# Patient Record
Sex: Female | Born: 1955 | Race: White | Hispanic: No | Marital: Single | Smoking: Never smoker
Health system: Southern US, Community
[De-identification: ages and names within clinical notes are randomized; demographics above are authoritative.]

## PROBLEM LIST (undated history)

## (undated) DIAGNOSIS — F419 Anxiety disorder, unspecified: Secondary | ICD-10-CM

## (undated) HISTORY — PX: SPINE SURGERY: SHX786

## (undated) HISTORY — DX: Anxiety disorder, unspecified: F41.9

## (undated) HISTORY — PX: BREAST SURGERY: SHX581

---

## 1998-05-31 ENCOUNTER — Other Ambulatory Visit: Admission: RE | Admit: 1998-05-31 | Discharge: 1998-05-31 | Payer: Self-pay | Admitting: *Deleted

## 1999-10-22 ENCOUNTER — Other Ambulatory Visit: Admission: RE | Admit: 1999-10-22 | Discharge: 1999-10-22 | Payer: Self-pay | Admitting: *Deleted

## 2000-11-10 ENCOUNTER — Other Ambulatory Visit: Admission: RE | Admit: 2000-11-10 | Discharge: 2000-11-10 | Payer: Self-pay | Admitting: *Deleted

## 2002-06-05 ENCOUNTER — Inpatient Hospital Stay (HOSPITAL_COMMUNITY): Admission: EM | Admit: 2002-06-05 | Discharge: 2002-06-07 | Payer: Self-pay | Admitting: Psychiatry

## 2002-06-20 ENCOUNTER — Other Ambulatory Visit: Admission: RE | Admit: 2002-06-20 | Discharge: 2002-06-20 | Payer: Self-pay | Admitting: *Deleted

## 2003-10-29 ENCOUNTER — Other Ambulatory Visit: Admission: RE | Admit: 2003-10-29 | Discharge: 2003-10-29 | Payer: Self-pay | Admitting: *Deleted

## 2004-12-11 ENCOUNTER — Other Ambulatory Visit: Admission: RE | Admit: 2004-12-11 | Discharge: 2004-12-11 | Payer: Self-pay | Admitting: *Deleted

## 2005-02-10 ENCOUNTER — Other Ambulatory Visit: Admission: RE | Admit: 2005-02-10 | Discharge: 2005-02-10 | Payer: Self-pay | Admitting: *Deleted

## 2005-07-27 ENCOUNTER — Other Ambulatory Visit: Admission: RE | Admit: 2005-07-27 | Discharge: 2005-07-27 | Payer: Self-pay | Admitting: *Deleted

## 2005-11-18 ENCOUNTER — Encounter: Admission: RE | Admit: 2005-11-18 | Discharge: 2005-11-18 | Payer: Self-pay | Admitting: Sports Medicine

## 2005-11-20 ENCOUNTER — Inpatient Hospital Stay (HOSPITAL_COMMUNITY): Admission: AD | Admit: 2005-11-20 | Discharge: 2005-11-21 | Payer: Self-pay | Admitting: Neurosurgery

## 2006-02-25 ENCOUNTER — Other Ambulatory Visit: Admission: RE | Admit: 2006-02-25 | Discharge: 2006-02-25 | Payer: Self-pay | Admitting: *Deleted

## 2006-07-05 ENCOUNTER — Inpatient Hospital Stay: Payer: Self-pay | Admitting: Psychiatry

## 2006-10-28 ENCOUNTER — Ambulatory Visit: Payer: Self-pay | Admitting: Family Medicine

## 2006-10-28 DIAGNOSIS — F5000 Anorexia nervosa, unspecified: Secondary | ICD-10-CM | POA: Insufficient documentation

## 2006-11-03 ENCOUNTER — Ambulatory Visit: Payer: Self-pay | Admitting: Sports Medicine

## 2006-11-03 DIAGNOSIS — E559 Vitamin D deficiency, unspecified: Secondary | ICD-10-CM | POA: Insufficient documentation

## 2006-11-03 DIAGNOSIS — IMO0002 Reserved for concepts with insufficient information to code with codable children: Secondary | ICD-10-CM | POA: Insufficient documentation

## 2006-11-04 ENCOUNTER — Ambulatory Visit: Payer: Self-pay | Admitting: Family Medicine

## 2006-12-28 ENCOUNTER — Ambulatory Visit: Payer: Self-pay | Admitting: Sports Medicine

## 2006-12-28 DIAGNOSIS — M461 Sacroiliitis, not elsewhere classified: Secondary | ICD-10-CM | POA: Insufficient documentation

## 2006-12-28 DIAGNOSIS — M25559 Pain in unspecified hip: Secondary | ICD-10-CM | POA: Insufficient documentation

## 2006-12-28 DIAGNOSIS — F329 Major depressive disorder, single episode, unspecified: Secondary | ICD-10-CM | POA: Insufficient documentation

## 2007-01-25 ENCOUNTER — Ambulatory Visit: Payer: Self-pay | Admitting: Sports Medicine

## 2007-04-26 ENCOUNTER — Other Ambulatory Visit: Admission: RE | Admit: 2007-04-26 | Discharge: 2007-04-26 | Payer: Self-pay | Admitting: *Deleted

## 2008-02-09 ENCOUNTER — Encounter (INDEPENDENT_AMBULATORY_CARE_PROVIDER_SITE_OTHER): Payer: Self-pay | Admitting: *Deleted

## 2008-04-16 ENCOUNTER — Ambulatory Visit: Payer: Self-pay | Admitting: Sports Medicine

## 2008-04-16 DIAGNOSIS — M533 Sacrococcygeal disorders, not elsewhere classified: Secondary | ICD-10-CM | POA: Insufficient documentation

## 2008-04-26 ENCOUNTER — Encounter: Payer: Self-pay | Admitting: Sports Medicine

## 2008-05-14 ENCOUNTER — Ambulatory Visit: Payer: Self-pay | Admitting: Sports Medicine

## 2008-05-14 DIAGNOSIS — M7750 Other enthesopathy of unspecified foot: Secondary | ICD-10-CM | POA: Insufficient documentation

## 2008-05-30 ENCOUNTER — Ambulatory Visit: Payer: Self-pay | Admitting: Sports Medicine

## 2008-05-30 DIAGNOSIS — M4120 Other idiopathic scoliosis, site unspecified: Secondary | ICD-10-CM | POA: Insufficient documentation

## 2008-08-15 ENCOUNTER — Encounter: Admission: RE | Admit: 2008-08-15 | Discharge: 2008-08-15 | Payer: Self-pay | Admitting: Gynecology

## 2009-07-16 ENCOUNTER — Ambulatory Visit: Payer: Self-pay | Admitting: Sports Medicine

## 2009-07-16 DIAGNOSIS — M2390 Unspecified internal derangement of unspecified knee: Secondary | ICD-10-CM | POA: Insufficient documentation

## 2009-07-16 DIAGNOSIS — M25469 Effusion, unspecified knee: Secondary | ICD-10-CM | POA: Insufficient documentation

## 2009-07-16 DIAGNOSIS — S8000XA Contusion of unspecified knee, initial encounter: Secondary | ICD-10-CM | POA: Insufficient documentation

## 2009-07-16 DIAGNOSIS — M7710 Lateral epicondylitis, unspecified elbow: Secondary | ICD-10-CM | POA: Insufficient documentation

## 2009-07-18 ENCOUNTER — Encounter: Payer: Self-pay | Admitting: Sports Medicine

## 2009-07-23 ENCOUNTER — Encounter: Payer: Self-pay | Admitting: Sports Medicine

## 2009-09-23 ENCOUNTER — Ambulatory Visit: Payer: Self-pay | Admitting: Family Medicine

## 2009-10-11 ENCOUNTER — Ambulatory Visit: Payer: Self-pay | Admitting: Family Medicine

## 2009-10-28 ENCOUNTER — Encounter: Payer: Self-pay | Admitting: Family Medicine

## 2010-01-13 ENCOUNTER — Ambulatory Visit: Payer: Self-pay | Admitting: Family Medicine

## 2010-01-13 DIAGNOSIS — F438 Other reactions to severe stress: Secondary | ICD-10-CM | POA: Insufficient documentation

## 2010-01-14 ENCOUNTER — Encounter: Payer: Self-pay | Admitting: Family Medicine

## 2010-02-07 ENCOUNTER — Ambulatory Visit: Payer: Self-pay | Admitting: Family Medicine

## 2010-02-28 ENCOUNTER — Emergency Department (HOSPITAL_COMMUNITY)
Admission: EM | Admit: 2010-02-28 | Discharge: 2010-02-28 | Payer: Self-pay | Source: Home / Self Care | Admitting: Emergency Medicine

## 2010-04-21 ENCOUNTER — Encounter: Payer: Self-pay | Admitting: Gynecology

## 2010-04-29 NOTE — Assessment & Plan Note (Signed)
Summary: ORTHOTICS   Vital Signs:  Patient Profile:   55 Years Old Female Height:     61 inches Pulse rate:   68 / minute BP sitting:   106 / 73  Vitals Entered By: Lillia Pauls CMA (May 30, 2008 8:57 AM)                 Chief Complaint:  ORTHOTICS.  History of Present Illness:  returns for orthotics Temp have helped lessen foot pain and low back and hip pain wants to run a marathon now training mostly on weekends and has done a 12 mile run this past week with some back pain  hip and SIJ doing much better        Physical Exam  General:     Well-developed,well-nourished,in no acute distress; alert,appropriate and cooperative throughout examination Msk:     marked transverse arch breakdown forefoot  2x width of heel MT pain is decreased heel pain is absent now  long arch is moderately collapsed    Impression & Recommendations:  Problem # 1:  METATARSALGIA (ICD-726.70) Temp inserts have helped but foot pain is still significant with a lot of arch collapse transverse and mod long  Patient was fitted for a standard, cushioned, semi-rigid orthotic.  The orthotic was heated and the patient stood on the orthotic blank positioned on the orthotic stand. The patient was positioned in subtalar neutral position and 10 degrees of ankle dorsiflexion in a weight bearing stance. After completion of molding a stable based was applied to the orthotic blank.   The blank was ground to a stable position for weight bearing. 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 One by mouth every 4-6 as needed for pain

## 2010-04-29 NOTE — Assessment & Plan Note (Signed)
Summary: KH   Vital Signs:  Patient Profile:   55 Years Old Female Height:     61 inches Weight:      105.8 pounds BMI:     20.06  Vitals Entered By: Wyona Almas PHD (November 04, 2006 2:42 PM)               History of Present Illness: Assessment:  Alyssa Alvarez has made a strong effort to eat 3 X day in the past week, successful on most days.  Although she did not journal, she did give much more thought to her food decisions and to the negative thoughts regarding food and weight.  She has determined that not eating is a slow form of suicide.  She also recognizes that she does want to meet the challenge of this depression and to begin feeling better, so she made a decision to try to nourish herself, aware now that depression is actually a sx of starvation, and medications won't be as effective at a severely low weight.  She has been eating a spinach salad for dinner most days, and yogurt for bkfst.  She went without the Red Bull on a couple days, w/ no bad effects.  Alyssa Alvarez is exploring the possibilty of in-pt tx for depression with her therapist Dayton Scrape, although her 3 experiences with such have been awful.  I suggested tx for her eating D/O (which would address psych co-morbidities as well) rather than for just depression, and I suggested Lu Duffel call me for suggested programs, if interested.  Nutrition Diagnosis:  Inadequate energy intake (NI-1.4) related to food restriction as evidenced by eating pattern of 2 meals/day and 24-hr recall of 1080 kcal.   Disordered eating pattern (NB-1.5) related to anorexia nervosa as evidenced by severe food and weight anxiety and food restrxn.    Intervention:   1. Journal negative thoughts re. food choices, and counter-journal.   2. Continue bkfst of 6 oz yogurt and dinner of spinach salad; 3 meals/day.  3. Write about:  What purpose does your eating D/O serve? 4. While away on business this wk:  email me no later than Wed re. journaling.   5. Stay connected  w/ friends vs. social w/drawal.    Monitoring/Eval:  Dietary intake, weight, and journal at F/U, 2 week.  [Wt range:  100-105.]

## 2010-04-29 NOTE — Letter (Signed)
Summary: [facility]/Prior Authorization request  Psychologist, educational Authorization request   Imported By: Marily Memos 01/14/2010 15:54:13  _____________________________________________________________________  External Attachment:    Type:   Image     Comment:   External Document
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
## 2010-04-29 NOTE — Assessment & Plan Note (Signed)
Summary: FU BACK/LEG PAIN/MJD   Vital Signs:  Patient Profile:   55 Years Old Female Height:     61 inches Pulse rate:   78 / minute BP sitting:   110 / 68  Vitals Entered By: Enid Baas MD (May 14, 2008 11:32 AM)                 Chief Complaint:  FU BACK AND LEG PAIN.  History of Present Illness: Alyssa Alvarez was much improved with medication first - down from a 10 to a 7 Went to PT with [name] - pain dropped to 2 of 10 after manipulation and therapy she has done her exercises for SI problems these help  she continues running has been able to increase mileage  ran on uneven surface of [place] and pain returned also having to get all her running into [employer] as working long hours  major financial problems can't afford PT as not in network        Physical Exam  General:     Well-developed,well-nourished,in no acute distress; alert,appropriate and cooperative throughout examination does not look in pain today  Msk:     neg SLR neg screen for disk si joint much more even and both mobile today lying cross over stretch is OK pretzel stretch brings out pain on left hip abduction and rotation strength is good Extremities:     widening of forefoot with collapse of transverse arch morton's calluses bunionettes  gait is loud with slight turnout of left foot/ forefoot slap  placed in temp orthotics and gait is definitely improved aslso less slap and not as loud    Impression & Recommendations:  Problem # 1:  SACROILIAC JOINT DYSFUNCTION (ICD-724.6) cont exrcises and add pretzel stretch  needs to get home program from PT as cannot afford RX at this point  I will reck in 2 wks  Problem # 2:  METATARSALGIA (ICD-726.70) given temp orthotics with MT pads also needs new shoes - badly worn small bunionettes as well  I would like to make custom orthotics but need to see if she can get some coverage Orders: Sports Insoles (L3510) Metatarsal  Pads (Z6109)   Complete Medication List: 1)  Tramadol Hcl 50 Mg Tabs (Tramadol hcl) .... One by mouth every 4-6 as needed for pain
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
## 2010-04-29 NOTE — Assessment & Plan Note (Signed)
Summary: eval fatigue / JCS   Vital Signs:  Patient Profile:   55 Years Old Female Height:     61 inches Weight:      102.9 pounds BMI:     19.51  Vitals Entered By: Wyona Almas PHD (October 28, 2006 1:01 PM)               History of Present Illness: Assessment:  Alyssa Alvarez was referred by Dr. Merri Brunette for eval of fatigue and possible eating D/O.  Alyssa Alvarez "quit eating" in 2002 when going thru a divorce, but had regained to  ~115 lb by Mar 2007, when she qualified for the [employer].  In Aug '07, she had back surg for 2 herniated disks & nerve entrapment, and in Nov '07, her boyfriend broke up w/ her.  Loss of her running as w as her boyfriend again triggered severe restrxn.  July 9 08 lab's indicate low potassium and hgb.  Usual eating pattern:  B- 1 Red Bull; L- out to eat w/ coworkers; D- 1 martini at home (3 shots total).  24-hr recall suggests kcal intake of 1080, although yesterday was a high-intake day.  Ex is erratic; averages 1 60-min walk-run per week.    Nutrition Diagnosis:  Inadequate energy intake (NI-1.4) related to food restriction as evidenced by eating pattern of 2 meals/day and 24-hr recall of 1080 kcal.   Disordered eating pattern (NB-1.5) related to anorexia nervosa as evidenced by severe food and weight anxiety and food restrxn.    Intervention:   1. Journal negative thoughts re. food choices and counter-journal, as explained today.   2. Breakfast:  Include at least 4 oz yogurt, and continue caffeine source, but reduced.   3. Dinner:  Pt is willing to try a spinach salad.   4. Did not address alcohol intake at this time.   5. Pt agreed to wt range of 100-105, blind wts at appts, and trusting me to make dietary adjustmts as needed to maintain that range.  (Plan is to increase kcal over time.)  Monitoring/Eval:  Dietary intake, weight, and journal at F/U, 1 week.  See Dr. Darrick Penna for injury eval next week, and obtain recommendations for ex.                       Appended Document: eval fatigue / JCS I reviewed and agree with treatment plan and approach.
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
## 2010-04-29 NOTE — Assessment & Plan Note (Signed)
Summary: F/U,[facility]   Vital Signs:  Patient profile:   55 year old female BP sitting:   102 / 69  Vitals Entered By: Lillia Pauls CMA (October 11, 2009 10:51 AM)  History of Present Illness: f/u left knee pain and effusion. Much better--pain is essentially gone when she is standing or moving. Still mildly TTP over quad..   Right elbow pain increases--has had hx of tennis elbow right with injection almost a year ago. Injection helped it resolve then. Over last few weeks she has noted increased similar pain, located lateral elbow, worse with forearm and hand movements. Nort dadiating. No hand numbness. No weakness. Pain better with rest. No specific injury   Current Medications (verified): 1)  Tramadol Hcl 50 Mg Tabs (Tramadol Hcl) .... One By Mouth Every 4-6 As Needed For Pain  Review of Systems       Please see HPI for additional ROS.   Physical Exam  General:  alert, well-developed, well-nourished, and well-hydrated.   Msk:  left quad is TTP in mid distal substance (vastus mintermedius) FROm and normal strength knee flexion and extension. No effusion noted. Skin without erythema or warmth distally NV intact   RIght elbow: TTP lateral epicondyle and associated muscle mass. Pain with resisted long finger extension. Pain with  pronation forearm. Distally NV intact   Impression & Recommendations:  Problem # 1:  JOINT EFFUSION, KNEE (ICD-719.06) resolved effusion still some ttp vastus medialis will start PT with her personal trainer--no sky diving until left quad is 90% strength and endurance of right  Problem # 2:  LATERAL EPICONDYLITIS (ICD-726.32)  injection today shewill start wearing her elbow band ICE no heavy arm work 24 hrs - 48 hrs  Orders: Joint Aspirate / Injection, Intermediate ([id number]) Kenalog 10 mg inj (J3301)  Complete Medication List: 1)  Tramadol Hcl 50 Mg Tabs (Tramadol hcl) .... One by mouth every 4-6 as needed for pain

## 2010-04-29 NOTE — Consult Note (Signed)
Summary: [place]  [facility]   Imported ByRanda Evens WATT 04/26/2008 13:43:37  _____________________________________________________________________  External Attachment:    Type:   Image     Comment:   External Document
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
## 2010-04-29 NOTE — Miscellaneous (Signed)
Summary: Rehab Report  Rehab Report   Imported By: Judge Stall 05/14/2008 10:46:12  _____________________________________________________________________  External Attachment:    Type:   Image     Comment:   External Document

## 2010-04-29 NOTE — Letter (Signed)
Summary: Generic Letter  [facility]  [address]   [place], [place] [address]   Phone: [phone]  Fax: [phone]    10/28/2009  TAWNEY VANORMAN [address] [place], [place]  [address]  Dear Ms. Gentz,  Sorry it took me so long to get back to youregarding a sports psychologist--the person I had traditionally referred to has moved. I asked Dr Darrick Penna and a few others and the Sports Psychology program at [facility] seemed to be the ultimate winner on all people's recommendatin list. THe lady listed below was mentioned by a couple of people. I have included her contact info.  Do not ignore this--the whole purpose of sports should be for health and enjoyment and I am not sure you are getting the best of either currently in sky diving.  Keep me posted---call me with questions.  Dr. Lanice Shirts, Assistant Professor Department of Kinesiology  [facility] [place] at [employer] [address], [place] [phone] ([phone] (fax)         Sincerely,   Denny Levy MD   Appended Document: Generic Letter mailed
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
## 2010-04-29 NOTE — Medication Information (Signed)
Summary: Medco-Flector Patch  Medco-Flector Patch   Imported By: Marily Memos 01/15/2010 12:14:10  _____________________________________________________________________  External Attachment:    Type:   Image     Comment:   External Document
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
## 2010-04-29 NOTE — Consult Note (Signed)
Summary: [facility]  [facility]   Imported By: Marily Memos 07/23/2009 09:46:19  _____________________________________________________________________  External Attachment:    Type:   Image     Comment:   External Document
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
## 2010-06-19 ENCOUNTER — Ambulatory Visit (INDEPENDENT_AMBULATORY_CARE_PROVIDER_SITE_OTHER): Payer: BC Managed Care – PPO | Admitting: Sports Medicine

## 2010-06-19 ENCOUNTER — Encounter: Payer: Self-pay | Admitting: Sports Medicine

## 2010-06-19 VITALS — BP 107/70 | Ht 60.0 in | Wt 105.0 lb

## 2010-06-19 DIAGNOSIS — M2390 Unspecified internal derangement of unspecified knee: Secondary | ICD-10-CM

## 2010-06-19 DIAGNOSIS — M7710 Lateral epicondylitis, unspecified elbow: Secondary | ICD-10-CM

## 2010-06-19 NOTE — Assessment & Plan Note (Signed)
She appears to have significant atrophy and skin thinning or RT lateral epidondyle.  This area is hypersensitive.  MSK [place] Partial tearing of extensor muscle and some chronic tennis elbow changes Edema at common extensor More distal in arm below radio capitellar joint there is a hypoechoic area that I think is some retraction of torn MM tendon unit There is a lot of neovessel activity in this area Less doppler flow at origin of common extensors  Advised to dress skin wounds and avoid trauma Needs to rest this elbow over next month and start only very light extension and rotation rehab rescan in 1 month If skin is healing I would try NTG topically - otherwise I think her skin is too sensitive for this
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
## 2010-07-17 ENCOUNTER — Encounter: Payer: Self-pay | Admitting: Sports Medicine

## 2010-07-17 ENCOUNTER — Ambulatory Visit (INDEPENDENT_AMBULATORY_CARE_PROVIDER_SITE_OTHER): Payer: BC Managed Care – PPO | Admitting: Sports Medicine

## 2010-07-17 VITALS — BP 108/73 | HR 72 | Ht 60.0 in | Wt 105.0 lb

## 2010-07-17 DIAGNOSIS — M7710 Lateral epicondylitis, unspecified elbow: Secondary | ICD-10-CM

## 2010-07-17 DIAGNOSIS — M2390 Unspecified internal derangement of unspecified knee: Secondary | ICD-10-CM

## 2010-07-17 NOTE — Assessment & Plan Note (Signed)
There is significant improvement in the right elbow. Today she passed all tests on examination. Her ultrasound had essentially returned to normal.  I gave her a progression of exercises. She is to restart poling at a light level for 2 weeks and then a moderate level for 2 weeks. We will check her in 6 weeks and if she has been able to successfully accomplish her rehabilitation and her increased activity level we will release her to return to her sports.

## 2010-07-17 NOTE — Assessment & Plan Note (Signed)
She should continue using the neoprene sleeve given by Dr. Thurston Hole. She should pay attention to any activities that create knee swelling or feeling of instability and avoid those. I think skydiving will continue to pose a risk but she simply needs to be aware of this and decide whether she wants to take that risk.  She can continue her activities as long as she does not find symptoms developing in the knee.

## 2010-07-17 NOTE — Patient Instructions (Signed)
Continue with exercises - try to do them twice daily. When you have no elbow pain ok to do easy poling for 2 weeks- if you are ok with this- ok to advance poling Use theraband for finger extensions
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
## 2010-08-15 NOTE — Op Note (Signed)
Alyssa Alvarez, Alyssa Alvarez                 ACCOUNT NO.:  [id number]   MEDICAL RECORD NO.:  [id number]          PATIENT TYPE:  INP   LOCATION:  3007                         FACILITY:  [facility]   PHYSICIAN:  Coletta Memos, M.D.     DATE OF BIRTH:  07/17/1955   DATE OF PROCEDURE:  11/20/2005  DATE OF DISCHARGE:                                 OPERATIVE REPORT   PREOPERATIVE DIAGNOSIS:  1. Displaced disk right L4, L5 in a forward lateral position.  2. Right L4 radiculopathy.   POSTOPERATIVE DIAGNOSIS:  1. Displaced disk right L4, L5 in a forward lateral position.  2. Right L4 radiculopathy.   PROCEDURE:  Right L4-5 far lateral diskectomy with microdissection.   COMPLICATIONS:  None.   SURGEON:  Coletta Memos, M.D.   ASSISTANT:  None.   ANESTHESIA:  General endotracheal.   INDICATIONS:  Alyssa Alvarez is a 55 year old who presented with severe pain  over the last 2 weeks.  MRI showed a large disk herniation in the far  lateral position on the right side at L4-5 which correlated with her pain.  I, therefore, recommended and she agreed to undergo operative decompression.   OPERATIVE NOTE:  Alyssa Alvarez was brought to the operating room, intubated, and  placed under general anesthetic without difficulty.  She was rolled prone  onto a Wilson frame and all pressure points were properly padded.  Her back  was prepped; and she was draped in a sterile fashion.  Using a preoperative  localizing film, I infiltrated 10 mL 1/2% lidocaine 1:200,000 epinephrine  into the right paraspinous musculature and underneath the skin of my  proposed incision site.   I opened the skin with a #10 blade and took this down to the thoracolumbar  fascia.  Then using monopolar cautery, I exposed the lamina of what I  believed to be L4.  I took an x-ray and this showed that I was at the  correct lamina position.  I then moved up so that I could gain access  through the pars interarticularis on the right side of the L4 lamina.   I  then placed a self-retaining retractor.  I used a high-speed drill.  We  drilled out just the lateral portion of the pars interarticularis, and the  medial portion of the superior facette of L5.  Having done this, I then  identified the ligamentum flavum, and removed that.  I then was in the  perineural fat and was able to positively identify the nerve root.  I  retracted that rostrally and then identified what appeared to be disk  material.   I then used pituitary rongeur and remove what were two medium sized pieces  of disk and then a very large piece of disk material.  I then looked at an  empty cavity underneath the root.  I inspected underneath the root  proximally and distally with a nerve hook; and felt that there was no more  disk material.  I then irrigated the wound.  I then closed in  a layered fashion using Vicryl sutures after infiltrating Solu-Medrol.  The  wound dressing was Dermabond which was applied after the wound closure.  The  patient tolerated the procedure well.  She was rolled prone, extubated, and  moving all extremities postoperatively.           ______________________________  Coletta Memos, M.D.     KC/MEDQ  D:  11/20/2005  T:  11/21/2005  Job:  [id number]
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
## 2010-08-28 ENCOUNTER — Encounter: Payer: Self-pay | Admitting: Sports Medicine

## 2010-08-28 ENCOUNTER — Ambulatory Visit (INDEPENDENT_AMBULATORY_CARE_PROVIDER_SITE_OTHER): Payer: BC Managed Care – PPO | Admitting: Sports Medicine

## 2010-08-28 DIAGNOSIS — M7710 Lateral epicondylitis, unspecified elbow: Secondary | ICD-10-CM

## 2010-08-28 DIAGNOSIS — M2390 Unspecified internal derangement of unspecified knee: Secondary | ICD-10-CM

## 2010-08-28 NOTE — Assessment & Plan Note (Addendum)
Patient with PCL tear of her left knee - Continue to pay attention to any activities that create knee swelling or feelings of instability and try to avoid those activities. - Knee brace she purchased this adequate and would be recommended with any intense activity where she is stressing the knee. - We again discussed sky-diving and feel this will continue to pose a risk for her knee, especially with landings. Discussed potential risks. Patient will have to assess whether she wants to continue skydiving despite the risks. - Should and sure adequate quad, hamstring strength to help stabilize the knee. Would benefit from this biking to help strengthen the knee. - She can continue her activities as long as she does not find symptoms developing in the knee

## 2010-08-28 NOTE — Assessment & Plan Note (Signed)
100% improved at this time, only mild tenderness to palpation over lateral epicondyles. She has regained full strength and has no pain with provocative testing. - Is okay to for her to continue with activities as she is doing. - Should contact us if symptoms worsening or return.

## 2010-08-28 NOTE — Progress Notes (Signed)
  Subjective:    Patient ID: Alyssa Alvarez, female    DOB: [birth year], 55 y.o.   MRN: [id number]  HPI 55 year old patient to office for followup of right lateral epicondylitis and left knee. Right elbow is 100% improved at this time, she is back to full poling activity, weight, and silk workouts. She has only occasional pain. Patient has PCL tear of her left knee, has occasional feelings of instability. She has intermittent swelling with running and left knee feels weaker than the right. Purchased hinged knee brace off the Internet, has not yet used this. She is considering returning to skydiving, but is unsure and afraid at this time.  Review of Systems Per history of present illness, otherwise negative    Objective:   Physical Exam GENERAL: Alert oriented x3, no contrast, pleasant MSK: - Elbow: Right elbow with full range of motion without pain. Small superficial abrasion over right lateral epicondyle, and no skin breakdown at this time. She has no pain with resisted extension of the right wrist were resisted extension of her fingers. Able to perform both tasks without any difficulty. No pain with resisted pronation of the right wrist and hand. - Knee: Left knee with positive sag sign and positive posterior drawer. Anterior cruciate ligament is stable, no laxity with varus and valgus stress testing. She has good quad and hamstring strength bilaterally. Neurovascularly intact distally       Assessment & Plan:

## 2010-11-21 ENCOUNTER — Ambulatory Visit (INDEPENDENT_AMBULATORY_CARE_PROVIDER_SITE_OTHER): Payer: BC Managed Care – PPO | Admitting: Family Medicine

## 2010-11-21 VITALS — BP 107/71

## 2010-11-21 DIAGNOSIS — M549 Dorsalgia, unspecified: Secondary | ICD-10-CM | POA: Insufficient documentation

## 2010-11-21 MED ORDER — TRIAMCINOLONE ACETONIDE 40 MG/ML IJ SUSP: 40.0000 mg | Freq: Once | INTRAMUSCULAR | Status: AC

## 2010-11-21 NOTE — Assessment & Plan Note (Addendum)
Trigger point injections over the affected areas done today.  She will get a massage to enhance the injections.  She will limit her activity to ones that are not aggravating to the pain. Avoid back spins and inversions for this weeks class.  Condition is ok.

## 2010-11-21 NOTE — Progress Notes (Signed)
  Subjective:    Patient ID: Alaja Goldinger, female    DOB: 1955/05/29, 55 y.o.   MRN: [id number]  HPI  55 y/o female is here complaining of right sided lower back pain x6 days after doing a controlled fall on silks followed by an inverted move on her dance pole.  The pain is worse with walking and prolonged standing.  No incontinence, radiating pain, weakness or numbness.     Review of Systems     Objective:   Physical Exam  Tender to palpation over lumbar paraspinal muscles right greater than left Pain is increased in the affected area with flexion and rotation to the right ROM is full in all directions DTR's 2+ left 1+ right (per patient this is normal for her) Strength 5/5 LE's  Consent obtained and verified. Time out taken Sterile betadine prep.  Topical analgesic spray: Ethyl chloride. Trigger point right lumbar paraspinal Completed without difficulty Meds:40 mg kenalog spread over 3 sites Aftercare instructions and Red flags advised.      Assessment & Plan:  Right lumbar muscles strain. Trigger point injections today if her back exercise handout program followup when necessary

## 2011-05-13 ENCOUNTER — Ambulatory Visit (INDEPENDENT_AMBULATORY_CARE_PROVIDER_SITE_OTHER): Payer: BC Managed Care – PPO | Admitting: Family Medicine

## 2011-05-13 DIAGNOSIS — M549 Dorsalgia, unspecified: Secondary | ICD-10-CM

## 2011-05-13 MED ORDER — PREDNISONE (PAK) 10 MG PO TABS: 10.0000 mg | ORAL_TABLET | Freq: Every day | ORAL | Status: AC

## 2011-05-13 NOTE — Assessment & Plan Note (Signed)
Mechanical back pain.  We will treat conservatively with a 6 day steroid taper, moist head, and gentle massage.  If this doesn't improve her pain we will consider formal physical therapy.
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
## 2015-06-05 ENCOUNTER — Ambulatory Visit (INDEPENDENT_AMBULATORY_CARE_PROVIDER_SITE_OTHER): Payer: BC Managed Care – PPO

## 2015-06-05 ENCOUNTER — Ambulatory Visit (INDEPENDENT_AMBULATORY_CARE_PROVIDER_SITE_OTHER): Payer: BC Managed Care – PPO | Admitting: Urgent Care

## 2015-06-05 VITALS — BP 108/64 | HR 79 | Temp 98.3°F | Resp 16 | Ht 59.0 in | Wt 102.0 lb

## 2015-06-05 DIAGNOSIS — M25471 Effusion, right ankle: Secondary | ICD-10-CM

## 2015-06-05 DIAGNOSIS — M25571 Pain in right ankle and joints of right foot: Secondary | ICD-10-CM

## 2015-06-05 DIAGNOSIS — S92151A Displaced avulsion fracture (chip fracture) of right talus, initial encounter for closed fracture: Secondary | ICD-10-CM | POA: Diagnosis not present

## 2015-06-05 DIAGNOSIS — S99911A Unspecified injury of right ankle, initial encounter: Secondary | ICD-10-CM

## 2015-06-05 MED ORDER — IBUPROFEN 200 MG PO TABS
400.0000 mg | ORAL_TABLET | Freq: Once | ORAL | Status: AC
  Administered 2015-06-05: 400 mg via ORAL

## 2015-06-05 NOTE — Patient Instructions (Addendum)
Because you received an x-ray today, you will receive an invoice from [facility]. Please contact [facility] at [phone] with questions or concerns regarding your invoice. Our billing staff will not be able to assist you with those questions.  You have an avulsion fracture of the talus.  Avulsion Fracture of the Foot An avulsion fracture of the foot is when a piece of bone in your foot has been torn away. Bones are connected to other bones by strong bands of connective tissue (ligaments). Muscles are also connected to bones with connective tissue (tendons). Avulsion fractures occur when severe stress on a bone from a ligament or tendon causes a small piece of bone to be pulled away.  Athletes may develop an avulsion fracture of the foot gradually (chronic avulsion fracture). The heel bone and the long bone in the foot that connect to the fifth toe (fifth metatarsal bone) are common areas of avulsion fracture of the foot.  CAUSES  An avulsion fracture of the foot can be caused by a sudden or repetitive twisting of your foot or ankle. It can also occur during a fall from a standing height.  RISK FACTORS You may have a higher risk of an avulsion fracture of the foot if you:   Participate in activities during which twisting the ankle or foot are likely, such as:  Dancing.  Track and field.  Walking or hiking on uneven surfaces.  Have had diabetes for many years.  Have osteoporosis. SIGNS AND SYMPTOMS The most common symptom of an avulsion fracture of the foot is intense pain at the time of injury. You may also feel a pop or tearing. The pain continues after the injury. Other signs and symptoms may include:  Swelling.  Bruising.  Pain with movement or weight bearing.  Difficulty walking.  Pain when pressure is applied to the injured area.  Warmth over the injured area. DIAGNOSIS  An avulsion fracture of the foot may be diagnosed by:  History. Your health care  provider will ask you what occurred during the time of your injury and whether you had any pain in the area before your injury.  Physical exam. During the exam, your health care provider may try to move your foot, toes, and ankle to check for pain and level of mobility.  X-ray. This will show if any bones are fractured or out of place.  MRI. This will show your tendons and ligaments. Some avulsion fractures are associated with an injury to a tendon or ligament. TREATMENT  Treatment for an avulsion fracture of the foot depends on the size of the displaced piece of bone and how far it has been pulled out of place. Small avulsion fractures may be treated with rest and support in a cast or brace. Large fragments of bone usually need to be reattached surgically. Treatment of these fractures may also require physical therapy to regain full use of your foot.  Treatments may include:  Rest, ice, compression, and elevations (RICE treatment) as directed by your health care provider.  Medicines that reduce pain and swelling (NSAIDs).  Wearing a splint, elastic wrap, support boot, or cast as directed by your health care provider.  Crutches or a rolling scooter to support your body weight until your foot heals.  Surgery to reattach the bone and tendon or ligament.  Physical therapy. This may last for several months. HOME CARE INSTRUCTIONS  Take medicines only as directed by your health care provider.  Rest your foot until your  health care provider says you can resume activity.  Keep your foot raised above the level of your heart when you are sitting or lying down.  Apply ice to the injured area:  Put ice in a plastic bag.  Place a towel between your skin and the bag.  Leave the ice on for 20 minutes, 2-3 times a day.  Do not allow your cast or splint to get wet as directed by your health care provider.  Keep all follow-up visits as directed by your health care provider. This is  important. SEEK MEDICAL CARE IF:  Your pain gets worse.  You have chills or fever.  Your cast or splint is damaged.  The cast has a bad odor or has stains caused by fluids from the wound. SEEK IMMEDIATE MEDICAL CARE IF:  Your foot is cold, blue, or pale.  You have pain, swelling, redness, or numbness below your cast or splint.   This information is not intended to replace advice given to you by your health care provider. Make sure you discuss any questions you have with your health care provider.   Document Released: 10/11/2013 Document Reviewed: 10/11/2013 Elsevier Interactive Patient Education [employer]2016 Elsevier Inc.

## 2015-06-05 NOTE — Progress Notes (Signed)
    MRN: [id number] DOB: [birth year]  Subjective:   Alyssa Alvarez is a 60 y.o. female presenting for chief complaint of Ankle Pain  Reports suffering right ankle injury today. Patient was training, using monkey bars, missed one and fell from a height of ~10 feet. She landed and rolled her right ankle laterally. She was not able to bear weight and has been ambulating with a limp. She currently reports severe pain over right lateral right ankle, swelling and slight bruising. Denies popping or tearing noises, bony deformity. Denies smoking cigarettes or drinking alcohol.   Alyssa Alvarez has a current medication list which includes the following prescription(s): bupropion, clorazepate, estradiol-norethindrone, and glucosamine-chondroitin, and the following Facility-Administered Medications: ibuprofen and triamcinolone acetonide. Also has No Known Allergies.  Alyssa Alvarez  has a past medical history of Anxiety. Also  has past surgical history that includes Breast surgery and Spine surgery.  Objective:   Vitals: BP 108/64 mmHg  Pulse 79  Temp(Src) 98.3 F (36.8 C) (Oral)  Resp 16  Ht 4\' 11"  (1.499 m)  Wt 102 lb (46.267 kg)  BMI 20.59 kg/m2  SpO2 97%  Physical Exam  Constitutional: She is oriented to person, place, and time. She appears well-developed and well-nourished.  Cardiovascular: Normal rate.   Pulmonary/Chest: Effort normal.  Musculoskeletal:       Right ankle: She exhibits decreased range of motion (eversion, inversion, dorsi-flexion), swelling and ecchymosis (over area outlined). She exhibits no deformity and no laceration. Tenderness. Lateral malleolus and AITFL tenderness found. No medial malleolus, no posterior TFL, no head of 5th metatarsal and no proximal fibula tenderness found. Achilles tendon exhibits no pain and no defect.       Feet:  Neurological: She is alert and oriented to person, place, and time.  Skin: Skin is warm and dry.   Dg Ankle Complete Right  06/05/2015  CLINICAL  DATA:  Fall this morning with right ankle pain, initial encounter EXAM: RIGHT ANKLE - COMPLETE 3+ VIEW COMPARISON:  None. FINDINGS: Small avulsion is noted from the lateral aspect of the talus. Mild soft tissue swelling is seen. No other focal bony abnormality is noted. IMPRESSION: Small talar avulsion Electronically Signed   By: Alcide CleverMark  Lukens M.D.   On: 06/05/2015 09:54   Assessment and Plan :   1. Avulsion fracture of talus, right, closed, initial encounter 2. Right ankle injury, initial encounter 3. Right ankle pain 4. Right ankle swelling - Will place in a short boot, urgent referral to Dr. Roanna EpleyBert Fields pending. I explained that she needs a cast and will take 4-8 weeks to heal. Patient verbalized understanding. 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 Follow up as needed.  Wallis BambergMario Deina Lipsey, PA-C [facility] [phone] 06/05/2015 9:23 AM

## 2015-06-12 ENCOUNTER — Ambulatory Visit: Payer: Self-pay | Admitting: Sports Medicine

## 2015-08-23 ENCOUNTER — Encounter: Payer: Self-pay | Admitting: Family Medicine

## 2015-08-23 ENCOUNTER — Ambulatory Visit (INDEPENDENT_AMBULATORY_CARE_PROVIDER_SITE_OTHER): Payer: BC Managed Care – PPO | Admitting: Family Medicine

## 2015-08-23 VITALS — BP 110/70 | Ht 59.0 in | Wt 104.0 lb

## 2015-08-23 DIAGNOSIS — M25512 Pain in left shoulder: Secondary | ICD-10-CM

## 2015-08-23 MED ORDER — METHYLPREDNISOLONE ACETATE 40 MG/ML IJ SUSP
40.0000 mg | Freq: Once | INTRAMUSCULAR | Status: AC
  Administered 2015-08-23: 40 mg via INTRA_ARTICULAR

## 2015-08-27 NOTE — Progress Notes (Signed)
   Subjective:    Patient ID: Alyssa Alvarez, female    DOB: [birth year], 60 y.o.   MRN: [id number]  HPI LEFT shoulder pain Started yesterday. While working out at gym felt a sharp pain. Not sure exactly what she was doing at the time.oday when she woke up it is very painful to move shoulder. Impairing her ability to do ADLs. Lives alone. Pain 4/10 at rest, 10/10 with  Movement. Ice did not help. Heat did not help.   Review of Systems Pertinent review of systems: negative for fever or unusual weight change. No hand numbness. No shoulder sweliing.     Objective:   Physical Exam Vital signs reviewed. GENERAL: Well developed, well nourished, no acute distress SHOULDER symmetrical. Left shoulder is nontender to palpation except  A little over anterior deltoid. Normal muscle bulk w no defects. PROM is full. AROM is painful but strength in the planes of rotator cuff seems to be intact---exam a little limited by pain. Negative drop arm test.ER is not painful, supraspinatis and IR cause some pain.. Biceps tendon intact, normal strength, mildy tender to palpation. NEURO: intact to soft touch in left forearm and hand VASC: radial pulses 2+ B= SKIN around left shoulder area reveals no ecchymoses, normal temperature, no redness and no lesions.   INJECTION: Patient was given informed consent, signed copy in the chart. Appropriate time out was taken. Area prepped and draped in usual sterile fashion. 1 cc of methylprednisolone 40 mg/ml plus 4 cc of 1% lidocaine without epinephrine was injected into the LEFT subacromial bursa using a(n) posterior approach. The patient tolerated the procedure well. There were no complications. Post procedure instructions were given.        Assessment & Plan:  Acute shoulder pain.  I do not think she has a muscle tear. It is possible she has a labral tear but exam is limited by pain. She istearful and concerned because she is unable to even dress herself without help. We  discussed options. I suspect this will resolve on its own. Given her history, she wants something to help right away as this is making her feel helpless and causing her pretty significant psychological stress. We agreed to try subacromial injection. I think little risk and it will likely help the acute inflammatory process. If she is not back to baseline in 7-10 days however, we need to see her back for further exam, US etc.

## 2020-01-05 ENCOUNTER — Other Ambulatory Visit: Payer: Self-pay

## 2020-01-05 ENCOUNTER — Ambulatory Visit: Payer: BC Managed Care – PPO | Admitting: Family Medicine

## 2020-01-05 VITALS — BP 120/76 | Ht 58.5 in | Wt 106.0 lb

## 2020-01-05 DIAGNOSIS — G8929 Other chronic pain: Secondary | ICD-10-CM | POA: Diagnosis not present

## 2020-01-05 DIAGNOSIS — M25512 Pain in left shoulder: Secondary | ICD-10-CM

## 2020-01-05 MED ORDER — METHYLPREDNISOLONE ACETATE 40 MG/ML IJ SUSP
40.0000 mg | Freq: Once | INTRAMUSCULAR | Status: AC
  Administered 2020-01-05: 40 mg via INTRA_ARTICULAR

## 2020-01-05 NOTE — Assessment & Plan Note (Signed)
Exam and history consistent with impingement and rotator cuff tendinopathy, low suspicion for tear as her strength remains intact, she does have pain and limited range of motion.  Passive range of motion is not limited, therefore no evidence of frozen shoulder.  Offered corticosteroid injection, patient would like to proceed.  Performed per above.  Patient tolerated procedure well.  Advised follow-up if no improvement.  She also has increased tonicity in her trapezius which is likely secondary to favoring her left arm from the pain.  We will have her do shoulder shrug exercises.

## 2020-01-05 NOTE — Progress Notes (Signed)
Alyssa Alvarez is a 64 y.o. female who presents to [facility] today for the following:  Left Shoulder Pain Had pain in 2017, had injection and did well for awhile 3 years ago hurt her left shoulder during yoga training, went to [facility], saw surgeon, had XR and MRI, was told that her shoulder was okay, but her discs in cervical spine "were flat" and she stopped skydiving because of this 2 months ago started worse again Had a HIIT class, was jumping and throwing weights and started to have pain On lateral edge of left shoulder, radiates into left side of neck She does yoga daily and runs Pain with overhead activity and clasping bra Pain with abduction, but can pull arm forward closer Hasn't had any numbness/tinlging down arm recently No neck pain She had some improvement after stretching it Has CKD 3, can only take tylenol, not helping Took a few advil in the last few days She works at [employer] and doesn't have to lift, but reaching to grab clothing from higher shelves hurts She is right handed   PMH reviewed.  ROS as above. Medications reviewed.  Exam:  BP 120/76   Ht 4' 10.5" (1.486 m)   Wt 106 lb (48.1 kg)   BMI 21.78 kg/m  Gen: Well NAD MSK:  Left Shoulder: Inspection reveals no obvious deformity, atrophy b/l.  Left shoulder held slightly higher than right with increased tonicity of left trapezius compared to right. No bruising. No swelling b/l Palpation is normal with no TTP over [facility] joint or bicipital groove b/l. Active ROM limited on left in flexion to 130 degrees, internal rotation 20 degrees, external rotation to 70 degrees, and abduction to 90 degrees.  Passive ROM on left is greater in all fields, but limited 2/2 pain and cooperation.  Full ROM in all fields on right. NV intact distally b/l Normal scapular function observed b/l Special Tests:  - Impingement: Positive Hawkins, neers, on left, negative on right. - Supraspinatous: Equivocal empty can on left,  negative on right.   - Infraspinatous/Teres Minor: 5/5 strength with ER b/l, pain with testing on left - Subscapularis: 5/5 strength with IR, pain with testing on left - Biceps tendon: Negative Speeds b/l  - Labrum: Negative Obriens on right, positive on left - AC Joint: Negative cross arm b/l - Negative apprehension test b/l - No painful arc and no drop arm sign b/l   Procedure performed: subacromial corticosteroid injection on left; palpation guided  Consent obtained and verified. Time-out conducted. Noted no overlying erythema, induration, or other signs of local infection. The left posterior subacromial space was palpated and marked. The overlying skin was prepped in a sterile fashion. Topical analgesic spray: Ethyl chloride. Joint: left subacromial Needle: 25 gauge, 1.5 inch Completed without difficulty. Meds: 5 cc 1% lidocaine without epinephrine, 40 mg depo-medrol in 1:4 ratio   Advised to call if fevers/chills, erythema, induration, drainage, or persistent bleeding.    Assessment and Plan: 1) Chronic left shoulder pain Exam and history consistent with impingement and rotator cuff tendinopathy, low suspicion for tear as her strength remains intact, she does have pain and limited range of motion.  Passive range of motion is not limited, therefore no evidence of frozen shoulder.  Offered corticosteroid injection, patient would like to proceed.  Performed per above.  Patient tolerated procedure well.  Advised follow-up if no improvement.  She also has increased tonicity in her trapezius which is likely secondary to favoring her left arm from the pain.  We will have her do shoulder shrug exercises.   Luis Abed, D.O.  PGY-3 Family Medicine  01/05/2020 12:16 PM

## 2020-01-05 NOTE — Patient Instructions (Signed)
Today you received an injection with corticosteroid. This injection is usually done in response to pain and inflammation. There is some "numbing" medicine also in the shot so the injected area may be numb and feel really good for the next couple of hours. The numbing medicine usually wears off in 2-3 hours though, and then your pain level will be right back where it was before the injection.   The actually benefit from the steroid injection is usually noticed in 2-7 days. You may actually experience a small (as in 10%) INCREASE in pain in the first 24 hours---that is common.   Things to watch out for that you should contact us or a health care provider urgently would include: 1. Unusual (as in more than 10%) increase in pain 2. New fever > 101.5 3. New swelling or redness of the injected area.  4. Streaking of red lines around the area injected.  Do not lift heavy weights for the next 48 hrs.    After this, start doing shoulder shrugs as we showed you with weight in your hands (at least 10 lbs, but whatever you feel like you are able to do).  Come back if you don't have improvement.

## 2020-01-05 NOTE — Progress Notes (Signed)
[facility]: Attending Note: I have reviewed the chart, discussed wit the Sports Medicine Fellow. I agree with assessment and treatment plan as detailed in the Fellow's note. Left shoulder pain consistent with rotator cuff syndrome and mild impingement.  Had a corticosteroid injection in 2017 for similar symptoms and that really helped her.  She did not have return of symptoms until recently.  We will perform CSI again today.  Home exercise program discussed.  She is very active in yoga.  I am quite happy to hear that her orthopedic physician finally talked her out of any more skydiving.  She is working part-time right now she is retired from her full-time job.  Overall she is doing well.  She will return as needed.

## 2020-02-27 ENCOUNTER — Other Ambulatory Visit: Payer: Self-pay

## 2020-02-27 ENCOUNTER — Ambulatory Visit: Payer: BC Managed Care – PPO | Admitting: Sports Medicine

## 2020-02-27 ENCOUNTER — Ambulatory Visit: Payer: Self-pay

## 2020-02-27 VITALS — BP 126/80 | Ht <= 58 in | Wt 104.0 lb

## 2020-02-27 DIAGNOSIS — M25512 Pain in left shoulder: Secondary | ICD-10-CM | POA: Diagnosis not present

## 2020-02-27 DIAGNOSIS — G8929 Other chronic pain: Secondary | ICD-10-CM

## 2020-02-27 MED ORDER — NITROGLYCERIN 0.2 MG/HR TD PT24: MEDICATED_PATCH | TRANSDERMAL | 1 refills | Status: AC

## 2020-02-27 NOTE — Assessment & Plan Note (Signed)
Ultrasound today consistent with Supraspinatus tendinopathy Fortunately no tear noted  NTG protocol Scap stab exercises RC exercises Reck in 6 weeks  Avoid loading left arm until reck

## 2020-02-27 NOTE — Progress Notes (Addendum)
CC; Continued left shoulder pain  Patient seen 10/8 at [facility] by Drs. Macariello and Jennette Kettle.  She got excellent pain relief for 2 weeks from a CSI Still very active with yoga and Pure Barre Overhead activity causes pain Certain positions in yoga cause pain Comes for recheck  ROS Painful arc above and reaching behind back Night pain if she rolls onto that shoulder No neck pain  PE Muscular F in NAD/ Note posture has shoulders protracted and head forward BP 126/80   Ht 4\' 10"  (1.473 m)   Wt 104 lb (47.2 kg)   BMI 21.74 kg/m  [facility] 01/05/2020  Frequency of aerobic exercise (# of days/week) 4  Average time in minutes 50  Frequency of strengthening activities (# of days/week) 7   Shoulder: left Inspection reveals no abnormalities, atrophy or asymmetry. Palpation is normal with no tenderness over AC joint or bicipital groove. ROM is limited in back scratch Rotator cuff strength normal throughout.  signs of impingement with  Neer and Hawkin's tests, empty can. Speeds and Yergason's tests normal. No labral pathology noted with negative Obrien's, negative clunk and good stability.  scapular protraction observed. + painful arc at 100 deg/ no drop arm sign. No apprehension sign  Ultrasound of Left Shoulder Biceps tendon normal SAX and LAX Supraspinatus tendon with some hypoechoic change and increased doppler flow No tear noted Infraspinatus, Teres Minor and subscapularis are normal AC joint with some mild increase in swelling  Impression: Ultrasound consistent with rotator cuff tendinopathy with no tear  Ultrasound and interpretation by Dr. 03/06/2020 and Verdis Prime. Carlei Huang, MD   03/05/20 Patient call back Pain with headaches and nausea using NTG She really probably tried to continue too much exercise with the shoulder Now pain is worse than at 11/30 visit  Plan to switch to arnica and/or voltaren gel Emphasize only ROM for Rotator cuff on left Give  this more rest and don't do any exercises that require effort with left shoulder Reck sooner at 2 weeks from now.  12/30, MD

## 2020-02-27 NOTE — Patient Instructions (Signed)

## 2020-03-19 ENCOUNTER — Other Ambulatory Visit: Payer: Self-pay

## 2020-03-19 ENCOUNTER — Ambulatory Visit: Payer: BC Managed Care – PPO | Admitting: Sports Medicine

## 2020-03-19 VITALS — BP 139/59 | Ht <= 58 in | Wt 105.0 lb

## 2020-03-19 DIAGNOSIS — G8929 Other chronic pain: Secondary | ICD-10-CM

## 2020-03-19 DIAGNOSIS — M25512 Pain in left shoulder: Secondary | ICD-10-CM | POA: Diagnosis not present

## 2020-03-19 MED ORDER — METHYLPREDNISOLONE ACETATE 40 MG/ML IJ SUSP
40.0000 mg | Freq: Once | INTRAMUSCULAR | Status: AC
  Administered 2020-03-19: 40 mg via INTRA_ARTICULAR

## 2020-03-19 NOTE — Progress Notes (Signed)
    SUBJECTIVE:   CHIEF COMPLAINT / HPI:   Chronic Left Shoulder Pain Arline Asp is not having any improvement and her pain seems to be getting worse. She cannot lift her arm above her head to reach things at work. She could not tolerate the nitro patches secondary to nausea.. She has been using the Arnica gel with minimal improvement. She did have great relief with the cortisone. She is having lots of night time pain.  PERTINENT  PMH / PSH: None  OBJECTIVE:   BP (!) 139/59   Ht 4\' 10"  (1.473 m)   Wt 105 lb (47.6 kg)   BMI 21.95 kg/m   [facility] 01/05/2020  Frequency of aerobic exercise (# of days/week) 4  Average time in minutes 50  Frequency of strengthening activities (# of days/week) 7    Shoulder, Left: No evidence of bony deformity, asymmetry, or muscle atrophy; No tenderness over long head of biceps (bicipital groove). No TTP at [facility] joint. Limited active and passive range of motion especially in flexion and external rotation, Unable to bring thumb to T12 without significant tenderness. Strength 5/5 throughout. No abnormal scapular function observed. Sensation intact. Peripheral pulses intact. Special Tests:   - Crossarm test: NEG - Jobe test: NEG   - Hawkins: Positive   - Neer test: Positive  + Frozen shoulder test left with limited ER at 20 deg abduction  ASSESSMENT/PLAN:   Chronic left shoulder pain Patient unable to tolerate NTG patches. Her pain is unimproved and now has pain limiting her motion there is concern for early stage of frozen shoulder. We will begin by stopping all weight bearing and strengthening exercises of the left shoulder and have her start ROM exercises only. Corticosteroid shot today given she had such a good response last time. - Injection notated below - F/u in 4 weeks   Verbal consent was obtained after discussing the risks and benefits of the procedure with the patient. A timeout was performed and the correct site and  side was identified and confirmed.  The left shoulder was cleaned in sterile fashion betadine and alcohol pad. 40 mg Depo-medrol and 3 cc 1% Lidocaine was injected using a posteriolateral approach directing the needle medially using a 5 cc syringe and 25 gauge 1 and 1/2 in needle. No complications were encountered. Minimal blood loss. A band aid was applied.    [facility], DO PGY-4, Sports Medicine Fellow [facility]  I observed and examined the patient with the [facility] resident and agree with assessment and plan. I believe this is the painful phase of an early frozen shoulder.  CSI today. Close f/u.  If not improved at amitriptyline at HS. Note reviewed and modified by me.  [facility], MD

## 2020-03-19 NOTE — Assessment & Plan Note (Signed)
Patient unable to tolerate NTG patches. Her pain is unimproved and now has pain limiting her motion there is concern for early stage of frozen shoulder. We will begin by stopping all weight bearing and strengthening exercises of the left shoulder and have her start ROM exercises only. Corticosteroid shot today given she had such a good response last time. - Injection notated below - F/u in 4 weeks

## 2020-03-19 NOTE — Patient Instructions (Signed)
It was great to see you today! Thank you for letting me participate in your care!  Today, we discussed your continued shoulder pain which is now looking more like the early signs of a frozen shoulder. Please do the exercises we discussed and please AVOID ALL weight bearing exercises or strength exercises for the left shoulder.  We will see you back in 4 weeks. Have a [place] Christmas and Happy Holidays!  Be well, Jules Schick, DO PGY-4, Sports Medicine Fellow [facility]

## 2020-04-09 ENCOUNTER — Ambulatory Visit: Payer: BC Managed Care – PPO | Admitting: Sports Medicine

## 2020-04-23 ENCOUNTER — Other Ambulatory Visit: Payer: Self-pay

## 2020-04-23 ENCOUNTER — Ambulatory Visit: Payer: BC Managed Care – PPO | Admitting: Sports Medicine

## 2020-04-23 DIAGNOSIS — M7502 Adhesive capsulitis of left shoulder: Secondary | ICD-10-CM

## 2020-04-23 DIAGNOSIS — M19012 Primary osteoarthritis, left shoulder: Secondary | ICD-10-CM | POA: Insufficient documentation

## 2020-04-23 NOTE — Progress Notes (Signed)
    SUBJECTIVE:   CHIEF COMPLAINT / HPI:   Left Shoulder Adhesive Capsulitis Alyssa Alvarez is a very pleasant 64y/o female who returns for follow up after having a glucocorticoid injection into the intraarticular left shoulder to help her with what we suspect is frozen shoulder. She has been doing the exercises we gave her and does report improved range of motion and improved pain levels. She states the injection helped her tremendously and took her pain down from a 10 to a tolerable level. She however still has pain with the pendulum movement and with other certain movements. She has been taking Advil 400mg  BID. She uses Arnica gel and biofreeze and states they do help temporarily.  PERTINENT  PMH / PSH: anorexia nervosa, depression  OBJECTIVE:   BP 102/74   [facility] 01/05/2020  Frequency of aerobic exercise (# of days/week) 4  Average time in minutes 50  Frequency of strengthening activities (# of days/week) 7    Shoulder, Left: No evidence of bony deformity, asymmetry, or muscle atrophy; No tenderness over long head of biceps (bicipital groove). No TTP at [facility] joint. Limited active and passive range of motion notably in external rotation. Can now get thumb to T12 but with significant tenderness. Strength 5/5 throughout. Sensation intact.   ASSESSMENT/PLAN:   Adhesive capsulitis of left shoulder Improved range of motion both passively and actively from previous exam. Pain improved. Did discuss some of her pain could be due to OA of the shoulder however it would not change or management so no x-rays today.  - Cont exercises and progress to using light weights (3lbs or less) once her ROM in the left shoulder is relatively pain free and matches her right - Stop using Advil, can use Voltaren gel along with Arnica gel - F/u in 6 weeks     [facility], DO PGY-4, Sports Medicine Fellow [facility]  I observed and examined the patient with the  SMFellow and agree with assessment and plan.  Note reviewed and modified by me. Note with patient having CKD3 we suggested using topicals and avoiding oral NSAIDs. Reck in 2 months. [facility], MD

## 2020-04-23 NOTE — Patient Instructions (Signed)
It was great to see you again! Thank you for letting me participate in your care once again!  Today, we discussed your improved shoulder range of motion and improving pain. Please continue doing the range of motion exercises and let that be the way you "push yourself". You can continue with yoga and pilates but please take it easy during those exercises. Once you feel like you've achieved relatively pain free normal range of motion in your left shoulder begin adding light weight (3lbs or less) to your exercises.  Instead of Advil please switch to using Voltaren Gel along with Arnica gel. I advised you discontinue the use of any oral NSAID. We will see you back in 6 weeks.  Be well, Jules Schick, DO PGY-4, Sports Medicine Fellow [facility]

## 2020-04-23 NOTE — Assessment & Plan Note (Addendum)
Improved range of motion both passively and actively from previous exam. Pain improved. Did discuss some of her pain could be due to OA of the shoulder however it would not change or management so no x-rays today.  - Cont exercises and progress to using light weights (3lbs or less) once her ROM in the left shoulder is relatively pain free and matches her right - Stop using Advil, can use Voltaren gel along with Arnica gel - F/u in 6 weeks

## 2020-06-04 ENCOUNTER — Other Ambulatory Visit: Payer: Self-pay

## 2020-06-04 ENCOUNTER — Ambulatory Visit
Admission: RE | Admit: 2020-06-04 | Discharge: 2020-06-04 | Disposition: A | Discharge status: Home / Self Care | Payer: BC Managed Care – PPO | Source: Ambulatory Visit | Attending: Family Medicine | Admitting: Family Medicine

## 2020-06-04 ENCOUNTER — Encounter: Payer: Self-pay | Admitting: Family Medicine

## 2020-06-04 ENCOUNTER — Ambulatory Visit (INDEPENDENT_AMBULATORY_CARE_PROVIDER_SITE_OTHER): Payer: BC Managed Care – PPO | Admitting: Family Medicine

## 2020-06-04 VITALS — BP 110/80 | Ht <= 58 in | Wt 110.0 lb

## 2020-06-04 DIAGNOSIS — M7502 Adhesive capsulitis of left shoulder: Secondary | ICD-10-CM | POA: Diagnosis not present

## 2020-06-04 NOTE — Progress Notes (Addendum)
   PCP: Wilfrid Lund, PA  Subjective:   HPI: Patient is a 65 y.o. female here for follow-up on left shoulder adhesive capsulitis.  She was last seen here on 04/23/2020 for follow-up after intra-articular left shoulder CSI injection.  At that time she was having some improvement in her range of motion and pain.  The plan was for her to continue her exercises and progress to using light weights as range of motion improved.  Today, patient states that she has not had significant improvement, in fact she has had some worsening of her pain.  She now does not notice much difficulty with range of motion and honestly does not have much pain with activity, but had a lot of soreness, stiffness and pain in the evenings after she finishes.  She continues to exercise excessively, up to 4 times per day.  She is doing a lot of upper body strengthening, yoga, and running.  She denies any new trauma or injury since last visit.  She denies any numbness tingling or weakness in her upper extremities.  Review of Systems:  Per HPI.   PMFSH, medications and smoking status reviewed.      Objective:  Physical Exam:  [facility] Exercise 01/05/2020  Frequency of aerobic exercise (# of days/week) 4  Average time in minutes 50  Frequency of strengthening activities (# of days/week) 7     Gen: awake, alert, NAD, comfortable in exam room Pulm: breathing unlabored  Left shoulder: -Inspection: no obvious deformity, atrophy, or asymmetry. No bruising. No swelling -Palpation: Mildly tender anteriorly, no TTP over AC joint or bicipital groove. -ROM: Very mildly limited passive and active external rotation and internal rotation up to T10-11  compared to T9-10 on contralateral side. NV intact distally Normal scapular function observed. Special Tests:  - Impingement:  Mildly positive Hawkins, Neg neers, Neg empty can sign. - Supraspinatus: Negative empty can.  5/5 strength with resisted flexion at 20  degrees - Infraspinatus/Teres Minor: 5/5 strength with ER - Subscapularis: 5/5 strength with IR - Biceps tendon: Neg Speeds, Neg Yerrgason's  - Labrum: Negative Obriens, good stability - No painful arc and no drop arm sign    Assessment & Plan:  1.  Adhesive capsulitis of left shoulder 2.  Left shoulder glenohumeral arthritis  Patient with what appears to be continued improvement in her adhesive capsulitis of her left shoulder.  She does continue to have some mild range of motion restriction, however her symptomatology currently seems most consistent with OA (worsening at night with rest rather than pain with activity).  Plan: -X-rays of the left shoulder -Continue conservative management with turmeric, Voltaren, Arnica, Tylenol (cannot take NSAIDs orally due to CKD 3) -Follow-up with Dr. Darrick Penna in 1 month to reevaluate, at that time could consider repeat corticosteroid injection   Guy Sandifer, MD [facility] Fellow 06/04/2020 9:27 AM  I was the preceptor for this visit and available for immediate consultation Marsa Aris, DO

## 2020-06-17 ENCOUNTER — Other Ambulatory Visit: Payer: Self-pay | Admitting: Obstetrics & Gynecology

## 2020-06-17 DIAGNOSIS — R928 Other abnormal and inconclusive findings on diagnostic imaging of breast: Secondary | ICD-10-CM

## 2020-07-04 ENCOUNTER — Other Ambulatory Visit: Payer: Self-pay

## 2020-07-04 ENCOUNTER — Ambulatory Visit: Payer: BC Managed Care – PPO

## 2020-07-04 ENCOUNTER — Ambulatory Visit
Admission: RE | Admit: 2020-07-04 | Discharge: 2020-07-04 | Disposition: A | Discharge status: Home / Self Care | Payer: BC Managed Care – PPO | Source: Ambulatory Visit | Attending: Obstetrics & Gynecology | Admitting: Obstetrics & Gynecology

## 2020-07-04 ENCOUNTER — Other Ambulatory Visit: Payer: Self-pay | Admitting: Obstetrics & Gynecology

## 2020-07-04 DIAGNOSIS — R928 Other abnormal and inconclusive findings on diagnostic imaging of breast: Secondary | ICD-10-CM

## 2020-07-30 ENCOUNTER — Other Ambulatory Visit: Payer: Self-pay

## 2020-07-30 ENCOUNTER — Ambulatory Visit: Payer: BC Managed Care – PPO | Admitting: Sports Medicine

## 2020-07-30 DIAGNOSIS — M19112 Post-traumatic osteoarthritis, left shoulder: Secondary | ICD-10-CM

## 2020-07-30 MED ORDER — MELOXICAM 7.5 MG PO TABS: 7.5000 mg | ORAL_TABLET | Freq: Every day | ORAL | 1 refills | Status: DC

## 2020-07-30 MED ORDER — MELOXICAM 7.5 MG PO TABS: 7.5000 mg | ORAL_TABLET | Freq: Every day | ORAL | 1 refills | Status: AC

## 2020-07-30 NOTE — Patient Instructions (Addendum)
Dr. [facility] [address] [place], [place] [phone]  Appt: 08/07/20 @ 9 am. Please arrive at 8:30 am

## 2020-07-30 NOTE — Assessment & Plan Note (Signed)
I had a long discussion that she could not regain full motion with exercises or PT With the significant changes she should talk with a shoulder specialist I advised her that I think she may need shoulder replacement if she wishes to return to her high levels of exercise and that this will take time  Will send to Dr. Ave Filter for his opinion

## 2020-07-30 NOTE — Progress Notes (Signed)
CC: worsening left shoulder pain  Patient has had 7 months of worsening shoulder pain Oct. 2021 CSI gave good relief for 1 month Nov. 2021 US showed some rotator cuff inflammation Rx of this made little difference Dec. 21 - Frozen shoulder sxs but CSI only helped briefly March 2022 - with continued worse ROM we obtained XR which showed severe OA of left shoulder  Patient has a history of: Anorexia Exercise "addiction" usually working out 3x each day Did Comptroller. Lots of strength work, runs, pure barre, etc. Denies previous dislocation but some periodic shoulder injuries   ROS Severe night pain Stiffness worse in morning and gets better with activity  PE Thin, depressed appearing W F BP 112/82   Ht 4' 10.5" (1.486 m)   Wt 111 lb (50.3 kg)   BMI 22.80 kg/m   Left shoulder Limited forward flexion Limited abduction and elevation Limited back scratch Rotator cuff strength is good except when pain intervenes  Review of XR Irregularity and severe OA

## 2020-08-08 ENCOUNTER — Other Ambulatory Visit: Payer: Self-pay | Admitting: Orthopedic Surgery

## 2020-08-16 ENCOUNTER — Other Ambulatory Visit: Payer: Self-pay | Admitting: Orthopedic Surgery

## 2020-08-16 DIAGNOSIS — M25512 Pain in left shoulder: Secondary | ICD-10-CM

## 2020-08-22 ENCOUNTER — Other Ambulatory Visit: Payer: Self-pay | Admitting: Sports Medicine

## 2020-08-23 ENCOUNTER — Other Ambulatory Visit: Payer: Self-pay | Admitting: Sports Medicine

## 2020-08-27 ENCOUNTER — Ambulatory Visit
Admission: RE | Admit: 2020-08-27 | Discharge: 2020-08-27 | Disposition: A | Discharge status: Home / Self Care | Payer: BC Managed Care – PPO | Source: Ambulatory Visit | Attending: Orthopedic Surgery | Admitting: Orthopedic Surgery

## 2020-08-27 ENCOUNTER — Other Ambulatory Visit: Payer: Self-pay

## 2020-08-27 DIAGNOSIS — M25512 Pain in left shoulder: Secondary | ICD-10-CM

## 2021-10-01 DIAGNOSIS — F331 Major depressive disorder, recurrent, moderate: Secondary | ICD-10-CM | POA: Diagnosis not present

## 2021-10-22 DIAGNOSIS — H524 Presbyopia: Secondary | ICD-10-CM | POA: Diagnosis not present

## 2021-10-22 DIAGNOSIS — H35362 Drusen (degenerative) of macula, left eye: Secondary | ICD-10-CM | POA: Diagnosis not present

## 2021-10-23 DIAGNOSIS — F331 Major depressive disorder, recurrent, moderate: Secondary | ICD-10-CM | POA: Diagnosis not present

## 2021-11-06 ENCOUNTER — Telehealth: Payer: Self-pay

## 2021-11-06 NOTE — Telephone Encounter (Signed)
Error not our patient

## 2021-11-14 DIAGNOSIS — F331 Major depressive disorder, recurrent, moderate: Secondary | ICD-10-CM | POA: Diagnosis not present

## 2021-11-20 DIAGNOSIS — F331 Major depressive disorder, recurrent, moderate: Secondary | ICD-10-CM | POA: Diagnosis not present

## 2021-11-28 DIAGNOSIS — F331 Major depressive disorder, recurrent, moderate: Secondary | ICD-10-CM | POA: Diagnosis not present

## 2021-12-09 DIAGNOSIS — F331 Major depressive disorder, recurrent, moderate: Secondary | ICD-10-CM | POA: Diagnosis not present

## 2021-12-17 DIAGNOSIS — F331 Major depressive disorder, recurrent, moderate: Secondary | ICD-10-CM | POA: Diagnosis not present

## 2021-12-31 DIAGNOSIS — F331 Major depressive disorder, recurrent, moderate: Secondary | ICD-10-CM | POA: Diagnosis not present

## 2022-01-08 DIAGNOSIS — F331 Major depressive disorder, recurrent, moderate: Secondary | ICD-10-CM | POA: Diagnosis not present

## 2022-01-21 DIAGNOSIS — F331 Major depressive disorder, recurrent, moderate: Secondary | ICD-10-CM | POA: Diagnosis not present

## 2022-01-30 DIAGNOSIS — M419 Scoliosis, unspecified: Secondary | ICD-10-CM | POA: Diagnosis not present

## 2022-01-30 DIAGNOSIS — E538 Deficiency of other specified B group vitamins: Secondary | ICD-10-CM | POA: Diagnosis not present

## 2022-01-30 DIAGNOSIS — Z23 Encounter for immunization: Secondary | ICD-10-CM | POA: Diagnosis not present

## 2022-01-30 DIAGNOSIS — F324 Major depressive disorder, single episode, in partial remission: Secondary | ICD-10-CM | POA: Diagnosis not present

## 2022-01-30 DIAGNOSIS — N183 Chronic kidney disease, stage 3 unspecified: Secondary | ICD-10-CM | POA: Diagnosis not present

## 2022-01-30 DIAGNOSIS — M545 Low back pain, unspecified: Secondary | ICD-10-CM | POA: Diagnosis not present

## 2022-01-30 DIAGNOSIS — Z8639 Personal history of other endocrine, nutritional and metabolic disease: Secondary | ICD-10-CM | POA: Diagnosis not present

## 2022-01-30 DIAGNOSIS — F411 Generalized anxiety disorder: Secondary | ICD-10-CM | POA: Diagnosis not present

## 2022-02-04 ENCOUNTER — Other Ambulatory Visit: Payer: Self-pay | Admitting: Family Medicine

## 2022-02-04 ENCOUNTER — Ambulatory Visit
Admission: RE | Admit: 2022-02-04 | Discharge: 2022-02-04 | Disposition: A | Discharge status: Home / Self Care | Payer: PPO | Source: Ambulatory Visit | Attending: Family Medicine | Admitting: Family Medicine

## 2022-02-04 DIAGNOSIS — F331 Major depressive disorder, recurrent, moderate: Secondary | ICD-10-CM | POA: Diagnosis not present

## 2022-02-04 DIAGNOSIS — M545 Low back pain, unspecified: Secondary | ICD-10-CM

## 2022-02-04 DIAGNOSIS — M5136 Other intervertebral disc degeneration, lumbar region: Secondary | ICD-10-CM | POA: Diagnosis not present

## 2022-02-04 DIAGNOSIS — M549 Dorsalgia, unspecified: Secondary | ICD-10-CM | POA: Diagnosis not present

## 2022-02-04 DIAGNOSIS — M4316 Spondylolisthesis, lumbar region: Secondary | ICD-10-CM | POA: Diagnosis not present

## 2022-02-04 DIAGNOSIS — M47816 Spondylosis without myelopathy or radiculopathy, lumbar region: Secondary | ICD-10-CM | POA: Diagnosis not present

## 2022-02-09 DIAGNOSIS — M461 Sacroiliitis, not elsewhere classified: Secondary | ICD-10-CM | POA: Diagnosis not present

## 2022-02-09 DIAGNOSIS — M419 Scoliosis, unspecified: Secondary | ICD-10-CM | POA: Diagnosis not present

## 2022-02-11 DIAGNOSIS — F331 Major depressive disorder, recurrent, moderate: Secondary | ICD-10-CM | POA: Diagnosis not present

## 2022-02-17 DIAGNOSIS — L570 Actinic keratosis: Secondary | ICD-10-CM | POA: Diagnosis not present

## 2022-02-17 DIAGNOSIS — C44311 Basal cell carcinoma of skin of nose: Secondary | ICD-10-CM | POA: Diagnosis not present

## 2022-02-17 DIAGNOSIS — L820 Inflamed seborrheic keratosis: Secondary | ICD-10-CM | POA: Diagnosis not present

## 2022-02-17 DIAGNOSIS — D485 Neoplasm of uncertain behavior of skin: Secondary | ICD-10-CM | POA: Diagnosis not present

## 2022-02-23 DIAGNOSIS — F331 Major depressive disorder, recurrent, moderate: Secondary | ICD-10-CM | POA: Diagnosis not present

## 2022-02-27 DIAGNOSIS — M461 Sacroiliitis, not elsewhere classified: Secondary | ICD-10-CM | POA: Diagnosis not present

## 2022-03-02 DIAGNOSIS — F331 Major depressive disorder, recurrent, moderate: Secondary | ICD-10-CM | POA: Diagnosis not present

## 2022-03-11 DIAGNOSIS — F331 Major depressive disorder, recurrent, moderate: Secondary | ICD-10-CM | POA: Diagnosis not present

## 2022-03-12 DIAGNOSIS — H0270 Unspecified degenerative disorders of eyelid and periocular area: Secondary | ICD-10-CM | POA: Diagnosis not present

## 2022-03-12 DIAGNOSIS — S01412S Laceration without foreign body of left cheek and temporomandibular area, sequela: Secondary | ICD-10-CM | POA: Diagnosis not present

## 2022-03-12 DIAGNOSIS — S01112S Laceration without foreign body of left eyelid and periocular area, sequela: Secondary | ICD-10-CM | POA: Diagnosis not present

## 2022-03-12 DIAGNOSIS — C441192 Basal cell carcinoma of skin of left lower eyelid, including canthus: Secondary | ICD-10-CM | POA: Diagnosis not present

## 2022-03-18 DIAGNOSIS — F331 Major depressive disorder, recurrent, moderate: Secondary | ICD-10-CM | POA: Diagnosis not present

## 2022-03-26 DIAGNOSIS — F331 Major depressive disorder, recurrent, moderate: Secondary | ICD-10-CM | POA: Diagnosis not present

## 2022-03-27 DIAGNOSIS — M461 Sacroiliitis, not elsewhere classified: Secondary | ICD-10-CM | POA: Diagnosis not present

## 2022-04-02 DIAGNOSIS — C44311 Basal cell carcinoma of skin of nose: Secondary | ICD-10-CM | POA: Diagnosis not present

## 2022-04-08 DIAGNOSIS — F331 Major depressive disorder, recurrent, moderate: Secondary | ICD-10-CM | POA: Diagnosis not present

## 2022-04-22 DIAGNOSIS — Z6823 Body mass index (BMI) 23.0-23.9, adult: Secondary | ICD-10-CM | POA: Diagnosis not present

## 2022-04-22 DIAGNOSIS — M461 Sacroiliitis, not elsewhere classified: Secondary | ICD-10-CM | POA: Diagnosis not present

## 2022-04-29 DIAGNOSIS — F331 Major depressive disorder, recurrent, moderate: Secondary | ICD-10-CM | POA: Diagnosis not present

## 2022-05-01 DIAGNOSIS — M461 Sacroiliitis, not elsewhere classified: Secondary | ICD-10-CM | POA: Diagnosis not present

## 2022-05-06 DIAGNOSIS — F331 Major depressive disorder, recurrent, moderate: Secondary | ICD-10-CM | POA: Diagnosis not present

## 2022-05-15 DIAGNOSIS — F331 Major depressive disorder, recurrent, moderate: Secondary | ICD-10-CM | POA: Diagnosis not present

## 2022-05-19 IMAGING — MG DIGITAL DIAGNOSTIC BILAT W/ TOMO W/ CAD
8 series · 8 of 8 positions shown · non-contrast
Comparison: Previous exam(s).

CLINICAL DATA: Screening recall for possible bilateral
calcifications. The patient has had implants previously which have
been removed. The patient has had 3 sets of silicone implants
previously, 2 sets which ruptured. She had her implants removed in
1223.

EXAM:
DIGITAL DIAGNOSTIC BILATERAL MAMMOGRAM WITH TOMOSYNTHESIS AND CAD;
ULTRASOUND LEFT BREAST LIMITED
TECHNIQUE: Bilateral digital diagnostic mammography and breast tomosynthesis
was performed. The images were evaluated with computer-aided
detection.; Targeted ultrasound examination of the left breast was
performed

[R ML]
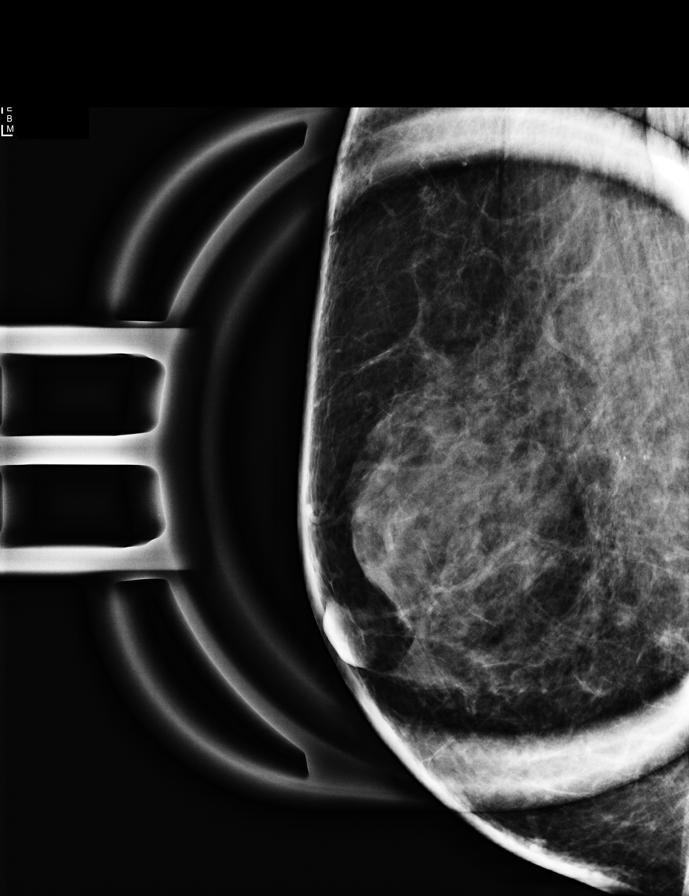

[R MLO]
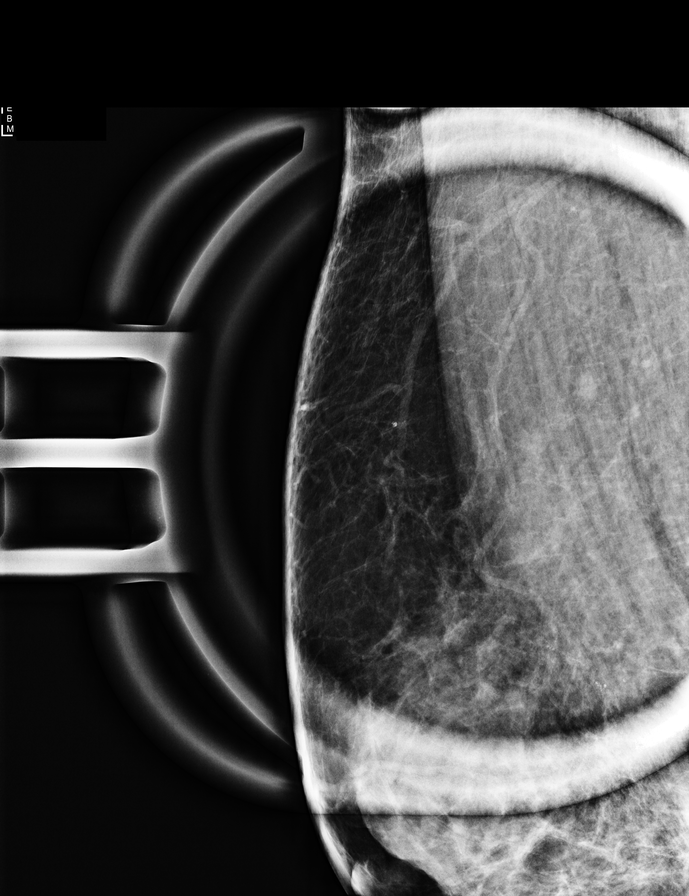

[R CC (1 of 2)]
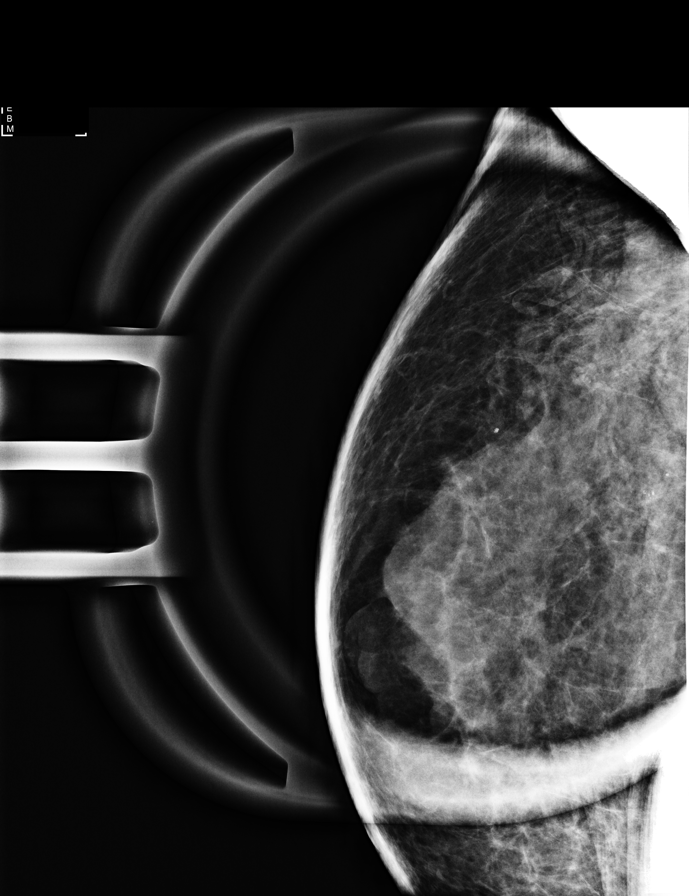

[L ML (1 of 2)]
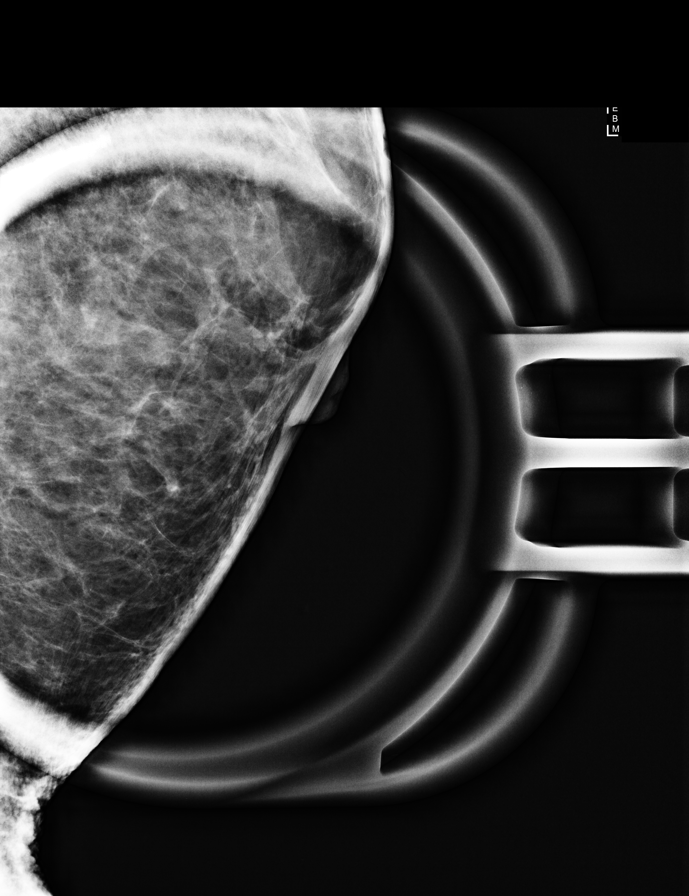

[L CC (1 of 2)]
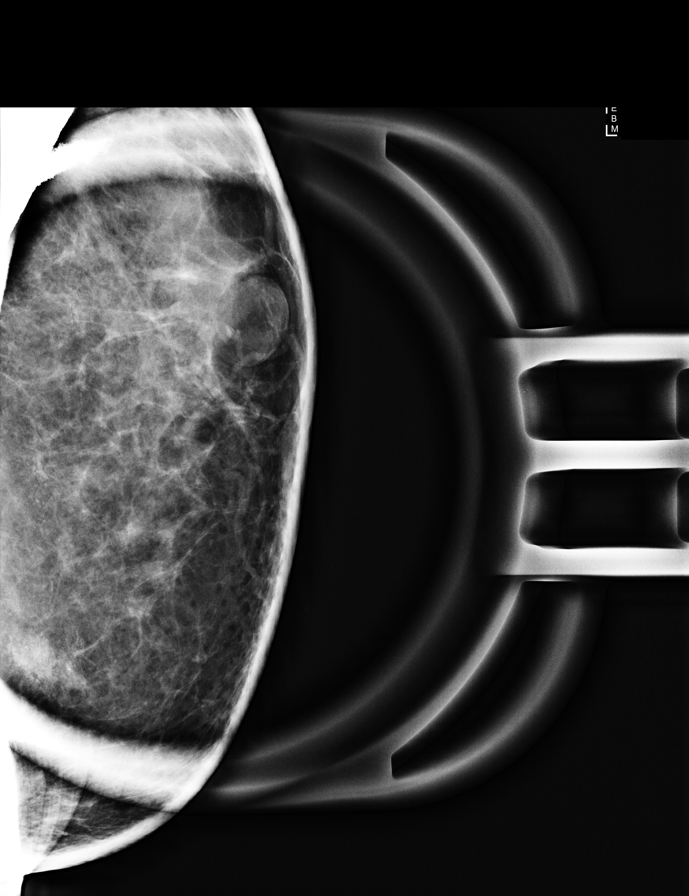

[R CC (2 of 2)]
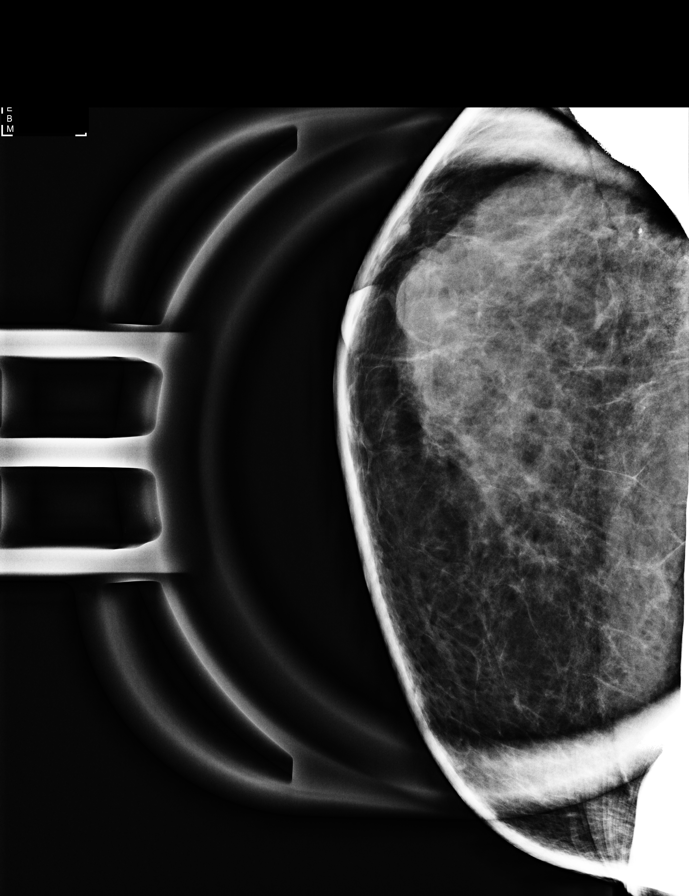

[L ML (2 of 2)]
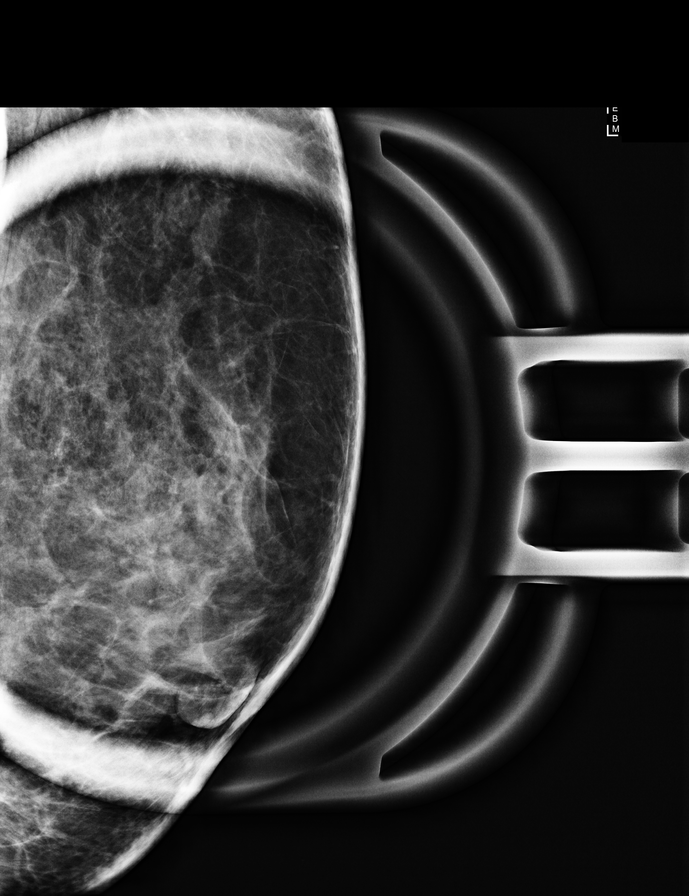

[L CC (2 of 2)]
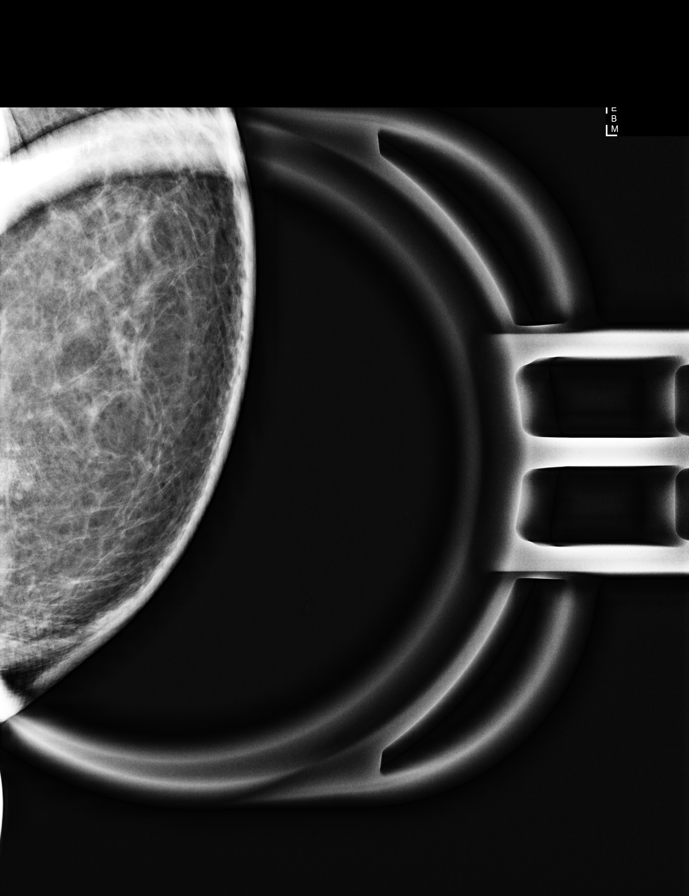

[8 of 8 positions shown; findings below may reference images not displayed]

ACR Breast Density Category c: The breast tissue is heterogeneously
dense, which may obscure small masses.
FINDINGS: Spot compression magnification images reveal persistent
calcifications in the upper inner far posterior right breast
spanning approximately 5 mm. In the upper inner far posterior left
breast, there is another 5 mm group of amorphous calcifications with
an asymmetry.

Ultrasound targeted to the site of the asymmetry in the left breast
at 10 o'clock, 4 cm from the nipple demonstrates a focal 1.5 cm area
of snowstorm appearance consistent with a focus of extracapsular
silicone. The patient states that this is the location of her most
recent silicone implant rupture.
IMPRESSION: 1. Likely benign bilateral calcifications, which may correspond with
capsular calcifications from her prior silicone implants.

RECOMMENDATION:
Six-month follow-up bilateral diagnostic mammogram.

I have discussed the findings and recommendations with the patient.
If applicable, a reminder letter will be sent to the patient
regarding the next appointment.

BI-RADS CATEGORY  3: Probably benign.

## 2022-05-26 DIAGNOSIS — M461 Sacroiliitis, not elsewhere classified: Secondary | ICD-10-CM | POA: Diagnosis not present

## 2022-05-27 DIAGNOSIS — F331 Major depressive disorder, recurrent, moderate: Secondary | ICD-10-CM | POA: Diagnosis not present

## 2022-06-12 DIAGNOSIS — F331 Major depressive disorder, recurrent, moderate: Secondary | ICD-10-CM | POA: Diagnosis not present

## 2022-06-18 DIAGNOSIS — F331 Major depressive disorder, recurrent, moderate: Secondary | ICD-10-CM | POA: Diagnosis not present

## 2022-07-01 DIAGNOSIS — F331 Major depressive disorder, recurrent, moderate: Secondary | ICD-10-CM | POA: Diagnosis not present

## 2022-07-03 DIAGNOSIS — F331 Major depressive disorder, recurrent, moderate: Secondary | ICD-10-CM | POA: Diagnosis not present

## 2022-07-09 DIAGNOSIS — F331 Major depressive disorder, recurrent, moderate: Secondary | ICD-10-CM | POA: Diagnosis not present

## 2022-07-22 DIAGNOSIS — F331 Major depressive disorder, recurrent, moderate: Secondary | ICD-10-CM | POA: Diagnosis not present

## 2022-07-31 DIAGNOSIS — F331 Major depressive disorder, recurrent, moderate: Secondary | ICD-10-CM | POA: Diagnosis not present

## 2022-08-07 DIAGNOSIS — F331 Major depressive disorder, recurrent, moderate: Secondary | ICD-10-CM | POA: Diagnosis not present

## 2022-08-13 DIAGNOSIS — F331 Major depressive disorder, recurrent, moderate: Secondary | ICD-10-CM | POA: Diagnosis not present

## 2022-08-20 DIAGNOSIS — Z6821 Body mass index (BMI) 21.0-21.9, adult: Secondary | ICD-10-CM | POA: Diagnosis not present

## 2022-08-20 DIAGNOSIS — Z01419 Encounter for gynecological examination (general) (routine) without abnormal findings: Secondary | ICD-10-CM | POA: Diagnosis not present

## 2022-08-21 DIAGNOSIS — F331 Major depressive disorder, recurrent, moderate: Secondary | ICD-10-CM | POA: Diagnosis not present

## 2022-08-31 DIAGNOSIS — F331 Major depressive disorder, recurrent, moderate: Secondary | ICD-10-CM | POA: Diagnosis not present

## 2022-09-10 DIAGNOSIS — Z85828 Personal history of other malignant neoplasm of skin: Secondary | ICD-10-CM | POA: Diagnosis not present

## 2022-09-10 DIAGNOSIS — N1831 Chronic kidney disease, stage 3a: Secondary | ICD-10-CM | POA: Diagnosis not present

## 2022-09-10 DIAGNOSIS — F324 Major depressive disorder, single episode, in partial remission: Secondary | ICD-10-CM | POA: Diagnosis not present

## 2022-09-10 DIAGNOSIS — Z136 Encounter for screening for cardiovascular disorders: Secondary | ICD-10-CM | POA: Diagnosis not present

## 2022-09-10 DIAGNOSIS — F411 Generalized anxiety disorder: Secondary | ICD-10-CM | POA: Diagnosis not present

## 2022-09-10 DIAGNOSIS — M8588 Other specified disorders of bone density and structure, other site: Secondary | ICD-10-CM | POA: Diagnosis not present

## 2022-09-10 DIAGNOSIS — Z8639 Personal history of other endocrine, nutritional and metabolic disease: Secondary | ICD-10-CM | POA: Diagnosis not present

## 2022-09-10 DIAGNOSIS — M47819 Spondylosis without myelopathy or radiculopathy, site unspecified: Secondary | ICD-10-CM | POA: Diagnosis not present

## 2022-09-10 DIAGNOSIS — Z9181 History of falling: Secondary | ICD-10-CM | POA: Diagnosis not present

## 2022-09-10 DIAGNOSIS — M8580 Other specified disorders of bone density and structure, unspecified site: Secondary | ICD-10-CM | POA: Diagnosis not present

## 2022-09-10 DIAGNOSIS — Z0000 Encounter for general adult medical examination without abnormal findings: Secondary | ICD-10-CM | POA: Diagnosis not present

## 2022-09-10 DIAGNOSIS — M461 Sacroiliitis, not elsewhere classified: Secondary | ICD-10-CM | POA: Diagnosis not present

## 2022-09-14 DIAGNOSIS — F331 Major depressive disorder, recurrent, moderate: Secondary | ICD-10-CM | POA: Diagnosis not present

## 2022-09-18 DIAGNOSIS — F331 Major depressive disorder, recurrent, moderate: Secondary | ICD-10-CM | POA: Diagnosis not present

## 2022-09-28 DIAGNOSIS — F331 Major depressive disorder, recurrent, moderate: Secondary | ICD-10-CM | POA: Diagnosis not present

## 2022-10-08 DIAGNOSIS — L820 Inflamed seborrheic keratosis: Secondary | ICD-10-CM | POA: Diagnosis not present

## 2022-10-08 DIAGNOSIS — H02729 Madarosis of unspecified eye, unspecified eyelid and periocular area: Secondary | ICD-10-CM | POA: Diagnosis not present

## 2022-10-08 DIAGNOSIS — Z411 Encounter for cosmetic surgery: Secondary | ICD-10-CM | POA: Diagnosis not present

## 2022-10-20 DIAGNOSIS — F331 Major depressive disorder, recurrent, moderate: Secondary | ICD-10-CM | POA: Diagnosis not present

## 2022-11-04 DIAGNOSIS — F331 Major depressive disorder, recurrent, moderate: Secondary | ICD-10-CM | POA: Diagnosis not present

## 2022-11-16 DIAGNOSIS — F331 Major depressive disorder, recurrent, moderate: Secondary | ICD-10-CM | POA: Diagnosis not present

## 2022-11-25 DIAGNOSIS — F331 Major depressive disorder, recurrent, moderate: Secondary | ICD-10-CM | POA: Diagnosis not present

## 2022-11-27 DIAGNOSIS — F411 Generalized anxiety disorder: Secondary | ICD-10-CM | POA: Diagnosis not present

## 2022-12-08 DIAGNOSIS — F331 Major depressive disorder, recurrent, moderate: Secondary | ICD-10-CM | POA: Diagnosis not present

## 2022-12-21 DIAGNOSIS — F331 Major depressive disorder, recurrent, moderate: Secondary | ICD-10-CM | POA: Diagnosis not present

## 2022-12-30 DIAGNOSIS — F331 Major depressive disorder, recurrent, moderate: Secondary | ICD-10-CM | POA: Diagnosis not present

## 2023-01-12 DIAGNOSIS — F331 Major depressive disorder, recurrent, moderate: Secondary | ICD-10-CM | POA: Diagnosis not present

## 2023-01-25 DIAGNOSIS — F331 Major depressive disorder, recurrent, moderate: Secondary | ICD-10-CM | POA: Diagnosis not present

## 2023-01-26 DIAGNOSIS — M461 Sacroiliitis, not elsewhere classified: Secondary | ICD-10-CM | POA: Diagnosis not present

## 2023-02-02 DIAGNOSIS — M461 Sacroiliitis, not elsewhere classified: Secondary | ICD-10-CM | POA: Diagnosis not present

## 2023-02-08 DIAGNOSIS — F331 Major depressive disorder, recurrent, moderate: Secondary | ICD-10-CM | POA: Diagnosis not present

## 2023-02-22 DIAGNOSIS — F331 Major depressive disorder, recurrent, moderate: Secondary | ICD-10-CM | POA: Diagnosis not present

## 2023-03-02 DIAGNOSIS — M461 Sacroiliitis, not elsewhere classified: Secondary | ICD-10-CM | POA: Diagnosis not present

## 2023-03-02 DIAGNOSIS — M545 Low back pain, unspecified: Secondary | ICD-10-CM | POA: Diagnosis not present

## 2023-03-04 DIAGNOSIS — F331 Major depressive disorder, recurrent, moderate: Secondary | ICD-10-CM | POA: Diagnosis not present

## 2023-03-08 DIAGNOSIS — F331 Major depressive disorder, recurrent, moderate: Secondary | ICD-10-CM | POA: Diagnosis not present

## 2023-03-16 DIAGNOSIS — F331 Major depressive disorder, recurrent, moderate: Secondary | ICD-10-CM | POA: Diagnosis not present

## 2023-03-25 DIAGNOSIS — H43813 Vitreous degeneration, bilateral: Secondary | ICD-10-CM | POA: Diagnosis not present

## 2023-03-25 DIAGNOSIS — D3132 Benign neoplasm of left choroid: Secondary | ICD-10-CM | POA: Diagnosis not present

## 2023-03-25 DIAGNOSIS — H35371 Puckering of macula, right eye: Secondary | ICD-10-CM | POA: Diagnosis not present

## 2023-03-29 DIAGNOSIS — F331 Major depressive disorder, recurrent, moderate: Secondary | ICD-10-CM | POA: Diagnosis not present

## 2023-04-24 ENCOUNTER — Other Ambulatory Visit: Payer: Self-pay

## 2023-04-24 ENCOUNTER — Encounter (HOSPITAL_COMMUNITY): Payer: Self-pay

## 2023-04-24 ENCOUNTER — Emergency Department (HOSPITAL_COMMUNITY)
Admission: EM | Admit: 2023-04-24 | Discharge: 2023-04-24 | Disposition: A | Discharge status: Home / Self Care | Payer: PPO | Attending: Emergency Medicine | Admitting: Emergency Medicine

## 2023-04-24 DIAGNOSIS — S59911A Unspecified injury of right forearm, initial encounter: Secondary | ICD-10-CM | POA: Diagnosis present

## 2023-04-24 DIAGNOSIS — Y92019 Unspecified place in single-family (private) house as the place of occurrence of the external cause: Secondary | ICD-10-CM | POA: Insufficient documentation

## 2023-04-24 DIAGNOSIS — W010XXA Fall on same level from slipping, tripping and stumbling without subsequent striking against object, initial encounter: Secondary | ICD-10-CM | POA: Diagnosis not present

## 2023-04-24 DIAGNOSIS — S51811A Laceration without foreign body of right forearm, initial encounter: Secondary | ICD-10-CM | POA: Insufficient documentation

## 2023-04-24 NOTE — ED Provider Notes (Addendum)
[facility] AT [facility] Provider Note   CSN: [id number] Arrival date & time: 04/24/23  7829     History  Chief Complaint  Patient presents with   Laceration    Alyssa Alvarez is a 68 y.o. female.  Patient is a 68 year old female with a history of anxiety who is presenting today after she fell at home while cleaning her Box.  When she fell her arm hit a hook that was on the side of the wall causing a tear in her right forearm.  She denies any injury otherwise from the fall.  She has no numbness or tingling of her hand.  Tetanus shot is up-to-date.  The history is provided by the patient.  Laceration      Home Medications Prior to Admission medications   Medication Sig Start Date End Date Taking? Authorizing Provider  buPROPion (WELLBUTRIN XL) 150 MG 24 hr tablet  06/29/10   [provider]  buPROPion (WELLBUTRIN XL) 150 MG 24 hr tablet  01/10/12   [provider]  clorazepate (TRANXENE) 3.75 MG tablet  06/29/10   [provider]  clorazepate (TRANXENE) 7.5 MG tablet  08/23/15   [provider]  Diclofenac Sodium (SOLARAZE) 3 % GEL  12/24/11   [provider]  estradiol-norethindrone (ACTIVELLA) 1-0.5 MG per tablet  06/26/10   [provider]  estradiol-norethindrone (ACTIVELLA) 1-0.5 MG tablet  01/15/12   [provider]  glucosamine-chondroitin 500-400 MG tablet Take 1 tablet by mouth 3 (three) times daily. Reported on 06/05/2015    [provider]  meloxicam (MOBIC) 7.5 MG tablet Take 1 tablet (7.5 mg total) by mouth daily. 07/30/20   Enid Baas, MD  nitroGLYCERIN (NITRODUR - DOSED IN MG/24 HR) 0.2 mg/hr patch Use 1/4 patch daily to the affected area. 02/27/20   Enid Baas, MD      Allergies    Patient has no known allergies.    Review of Systems   Review of Systems  Physical Exam Updated Vital Signs BP 124/73 (BP Location: Right Arm)   Pulse 68   Temp 98.2 F (36.8  C)   Resp 16   Ht 4' 10.5" (1.486 m)   Wt 50.3 kg   SpO2 99%   BMI 22.80 kg/m  Physical Exam Vitals and nursing note reviewed.  Constitutional:      General: She is not in acute distress.    Appearance: She is well-developed.  HENT:     Head: Normocephalic and atraumatic.  Eyes:     Conjunctiva/sclera: Conjunctivae normal.     Pupils: Pupils are equal, round, and reactive to light.  Cardiovascular:     Rate and Rhythm: Normal rate and regular rhythm.     Heart sounds: No murmur heard. Pulmonary:     Effort: Pulmonary effort is normal. No respiratory distress.     Breath sounds: Normal breath sounds. No wheezing or rales.  Chest:     Chest wall: No tenderness or crepitus.  Abdominal:     General: There is no distension.     Palpations: Abdomen is soft.     Tenderness: There is no abdominal tenderness. There is no guarding or rebound.  Musculoskeletal:        General: Tenderness present. Normal range of motion.     Right forearm: Laceration present.       Arms:     Cervical back: Normal range of motion and neck supple.     Comments: 3  cm laceration through the subcutaneous tissue with tears in the skin as well  Skin:    General: Skin is warm and dry.     Findings: No erythema or rash.  Neurological:     Mental Status: She is alert and oriented to person, place, and time.  Psychiatric:        Behavior: Behavior normal.     ED Results / Procedures / Treatments   Labs (all labs ordered are listed, but only abnormal results are displayed) Labs Reviewed - No data to display  EKG None  Radiology No results found.  Procedures Procedures   LACERATION REPAIR Performed by: [facility] Authorized by: Gwyneth Sprout Consent: Verbal consent obtained. Risks and benefits: risks, benefits and alternatives were discussed Consent given by: patient Patient identity confirmed: provided demographic data Prepped and Draped in normal sterile fashion Wound  explored  Laceration Location: right forearm  Laceration Length: 3cm  No Foreign Bodies seen or palpated  Anesthesia: local infiltration  Local anesthetic: lidocaine 2% with epinephrine  Anesthetic total: 2 ml  Irrigation method: syringe Amount of cleaning: standard  Skin closure: 4.0 vicryl rapide  Number of sutures: 5  Technique: simple interrupted  Patient tolerance: Patient tolerated the procedure well with no immediate complications.  Medications Ordered in ED Medications - No data to display  ED Course/ Medical Decision Making/ A&P                                 Medical Decision Making  Patient presenting after a trip and fall without injury to the forearm.  Wound repaired as above.  No other injuries noted.  Neurovascularly intact.  Tetanus shot is up-to-date.        Final Clinical Impression(s) / ED Diagnoses Final diagnoses:  Laceration of right forearm, initial encounter    Rx / DC Orders ED Discharge Orders     None         Gwyneth Sprout, MD 04/24/23 1324    Gwyneth Sprout, MD 04/24/23 [phone]

## 2023-04-24 NOTE — ED Triage Notes (Signed)
Tripped while walking and command hook caused lac to right forearm.  Tetanus UTD.  Did not hit head.  No complaints of bone pain.

## 2023-04-24 NOTE — ED Notes (Signed)
Arm bandaged and wrapped per provider.

## 2023-04-24 NOTE — Discharge Instructions (Addendum)
Your stitches are dissolvable so you should be able to pull them off in about 7 to 10 days.  You can put some Vaseline over the wound and it is okay to take a shower just do not scrub or soak the wound.

## 2023-06-10 DIAGNOSIS — F331 Major depressive disorder, recurrent, moderate: Secondary | ICD-10-CM | POA: Diagnosis not present

## 2023-06-23 DIAGNOSIS — F331 Major depressive disorder, recurrent, moderate: Secondary | ICD-10-CM | POA: Diagnosis not present

## 2023-07-01 DIAGNOSIS — F331 Major depressive disorder, recurrent, moderate: Secondary | ICD-10-CM | POA: Diagnosis not present

## 2023-07-08 DIAGNOSIS — F331 Major depressive disorder, recurrent, moderate: Secondary | ICD-10-CM | POA: Diagnosis not present

## 2023-07-22 DIAGNOSIS — F331 Major depressive disorder, recurrent, moderate: Secondary | ICD-10-CM | POA: Diagnosis not present

## 2023-07-28 DIAGNOSIS — M461 Sacroiliitis, not elsewhere classified: Secondary | ICD-10-CM | POA: Diagnosis not present

## 2023-07-28 DIAGNOSIS — M545 Low back pain, unspecified: Secondary | ICD-10-CM | POA: Diagnosis not present

## 2023-08-04 DIAGNOSIS — F331 Major depressive disorder, recurrent, moderate: Secondary | ICD-10-CM | POA: Diagnosis not present

## 2023-08-20 DIAGNOSIS — M533 Sacrococcygeal disorders, not elsewhere classified: Secondary | ICD-10-CM | POA: Diagnosis not present

## 2023-08-20 DIAGNOSIS — G8929 Other chronic pain: Secondary | ICD-10-CM | POA: Diagnosis not present

## 2023-08-25 DIAGNOSIS — F331 Major depressive disorder, recurrent, moderate: Secondary | ICD-10-CM | POA: Diagnosis not present

## 2023-08-28 DIAGNOSIS — N1831 Chronic kidney disease, stage 3a: Secondary | ICD-10-CM | POA: Diagnosis not present

## 2023-09-09 DIAGNOSIS — F331 Major depressive disorder, recurrent, moderate: Secondary | ICD-10-CM | POA: Diagnosis not present

## 2023-09-14 DIAGNOSIS — G8929 Other chronic pain: Secondary | ICD-10-CM | POA: Diagnosis not present

## 2023-09-14 DIAGNOSIS — N1831 Chronic kidney disease, stage 3a: Secondary | ICD-10-CM | POA: Diagnosis not present

## 2023-09-14 DIAGNOSIS — Z0000 Encounter for general adult medical examination without abnormal findings: Secondary | ICD-10-CM | POA: Diagnosis not present

## 2023-09-14 DIAGNOSIS — F411 Generalized anxiety disorder: Secondary | ICD-10-CM | POA: Diagnosis not present

## 2023-09-14 DIAGNOSIS — Z85828 Personal history of other malignant neoplasm of skin: Secondary | ICD-10-CM | POA: Diagnosis not present

## 2023-09-14 DIAGNOSIS — F4322 Adjustment disorder with anxiety: Secondary | ICD-10-CM | POA: Diagnosis not present

## 2023-09-14 DIAGNOSIS — E782 Mixed hyperlipidemia: Secondary | ICD-10-CM | POA: Diagnosis not present

## 2023-09-14 DIAGNOSIS — Z5320 Procedure and treatment not carried out because of patient's decision for unspecified reasons: Secondary | ICD-10-CM | POA: Diagnosis not present

## 2023-09-14 DIAGNOSIS — M8580 Other specified disorders of bone density and structure, unspecified site: Secondary | ICD-10-CM | POA: Diagnosis not present

## 2023-09-14 DIAGNOSIS — F321 Major depressive disorder, single episode, moderate: Secondary | ICD-10-CM | POA: Diagnosis not present

## 2023-09-14 DIAGNOSIS — D7589 Other specified diseases of blood and blood-forming organs: Secondary | ICD-10-CM | POA: Diagnosis not present

## 2023-09-14 DIAGNOSIS — M419 Scoliosis, unspecified: Secondary | ICD-10-CM | POA: Diagnosis not present

## 2023-09-26 DIAGNOSIS — N1831 Chronic kidney disease, stage 3a: Secondary | ICD-10-CM | POA: Diagnosis not present

## 2023-09-27 DIAGNOSIS — F411 Generalized anxiety disorder: Secondary | ICD-10-CM | POA: Diagnosis not present

## 2023-09-27 DIAGNOSIS — F331 Major depressive disorder, recurrent, moderate: Secondary | ICD-10-CM | POA: Diagnosis not present

## 2023-09-27 DIAGNOSIS — N1831 Chronic kidney disease, stage 3a: Secondary | ICD-10-CM | POA: Diagnosis not present

## 2023-09-27 DIAGNOSIS — E782 Mixed hyperlipidemia: Secondary | ICD-10-CM | POA: Diagnosis not present

## 2023-09-27 DIAGNOSIS — M47819 Spondylosis without myelopathy or radiculopathy, site unspecified: Secondary | ICD-10-CM | POA: Diagnosis not present

## 2023-09-27 DIAGNOSIS — F321 Major depressive disorder, single episode, moderate: Secondary | ICD-10-CM | POA: Diagnosis not present

## 2023-10-07 ENCOUNTER — Encounter: Payer: Self-pay | Admitting: Adult Health

## 2023-10-07 ENCOUNTER — Telehealth: Admitting: Adult Health

## 2023-10-07 VITALS — BP 115/69 | Ht <= 58 in | Wt 105.0 lb

## 2023-10-07 DIAGNOSIS — F411 Generalized anxiety disorder: Secondary | ICD-10-CM | POA: Diagnosis not present

## 2023-10-07 NOTE — Progress Notes (Signed)
 Virtual Visit via Video Note   I connected with pt @ on 10/07/23 at  10:00 AM EDT by a video enabled telemedicine application and verified that I am speaking with the correct person using two identifiers.   I discussed the limitations of evaluation and management by telemedicine and the availability of in person appointments. The patient expressed understanding and agreed to proceed.   I discussed the assessment and treatment plan with the patient. The patient was provided an opportunity to ask questions and all were answered. The patient agreed with the plan and demonstrated an understanding of the instructions.   The patient was advised to call back or seek an in-person evaluation if the symptoms worsen or if the condition fails to improve as anticipated.   I provided 60 minutes of non-face-to-face time during this encounter.  The patient was located at home.  The provider was located at [facility].     [name], NP    [facility] MD/PA/NP Initial Note  10/07/2023 10:54 AM Alyssa Alvarez  MRN:  [id number]  Chief Complaint:   HPI:   Patient seen today for initial psychiatric evaluation.   Referred by therapist [name].  Describes mood today as ok. Pleasant. Reports tearfulness at times. Mood symptoms - denies depression - I feel sad - I wish things were different. Reports varying interest and motivation. Reports increased anxiety - it's awful. Reports panic attacks - once every few weeks - will just freeze. Reports worry, rumination and over thinking - tries to lock things away in boxes - all the pain. I can't look at the past easily - trying to use the techniques she's learned to walk away from it. Denies obsessive thoughts and acts. Reports having a routine life - doesn't go anywhere - has a few close friends. Reports taking current medication regimen as prescribed, but is willing to consider other options. She has reduced Wellbutrin XL 450mg   daily to 300mg  daily and feels it has helped with the anxiety. Willing to decrease dose to 150mg  daily for possible further benefit. Taking medications as prescribed.  Energy levels stable. Active, has a regular exercise routine.  Enjoys some usual interests and activities. Divorced x 3. Lives alone with cat. Reports she has no family relationships. Has a daughter 1 and 4 grandchildren. Brother - deceased. Both parents local.  Has not spoken with parents in 34 or 12 years.  Appetite adequate. Weight stable - 104 pounds - 58. Sleeps well most nights. Averages 12 hours. Focus and concentration stable. Completing tasks. Managing aspects of household. Works part time at [employer]. Denies SI or HI.  Denies AH or VH. Denies self harm. Denies substance use.  Reports a trauma history.  Working with [name] - therapist.  Previous medication trials:     Visit Diagnosis:    ICD-10-CM   1. Generalized anxiety disorder  F41.1       Past Psychiatric History: Reports 3 hospitalizations - short stay.   Past Medical History:  Past Medical History:  Diagnosis Date   Anxiety     Past Surgical History:  Procedure Laterality Date   BREAST SURGERY     SPINE SURGERY      Family Psychiatric History: Reports family history of mental illness.   Family History: No family history on file.  Social History:  Social History   Socioeconomic History   Marital status: Single    Spouse name: Not on file   Number of children: Not on  file   Years of education: Not on file   Highest education level: Not on file  Occupational History   Not on file  Tobacco Use   Smoking status: Never   Smokeless tobacco: Never  Substance and Sexual Activity   Alcohol use: Never   Drug use: Never   Sexual activity: Not on file  Other Topics Concern   Not on file  Social History Narrative   Not on file   Social Drivers of Health   Financial Resource Strain: Not on file  Food Insecurity: Not on  file  Transportation Needs: Not on file  Physical Activity: Not on file  Stress: Not on file  Social Connections: Not on file    Allergies: No Known Allergies  Metabolic Disorder Labs: No results found for: HGBA1C, MPG No results found for: PROLACTIN No results found for: CHOL, TRIG, HDL, CHOLHDL, VLDL, LDLCALC No results found for: TSH  Therapeutic Level Labs: No results found for: LITHIUM No results found for: VALPROATE No results found for: CBMZ  Current Medications: Current Outpatient Medications  Medication Sig Dispense Refill   buPROPion (WELLBUTRIN XL) 150 MG 24 hr tablet      buPROPion (WELLBUTRIN XL) 150 MG 24 hr tablet      clorazepate (TRANXENE) 3.75 MG tablet      clorazepate (TRANXENE) 7.5 MG tablet      Diclofenac Sodium (SOLARAZE) 3 % GEL      estradiol-norethindrone (ACTIVELLA) 1-0.5 MG per tablet      estradiol-norethindrone (ACTIVELLA) 1-0.5 MG tablet      glucosamine-chondroitin 500-400 MG tablet Take 1 tablet by mouth 3 (three) times daily. Reported on 06/05/2015     meloxicam  (MOBIC ) 7.5 MG tablet Take 1 tablet (7.5 mg total) by mouth daily. 30 tablet 1   nitroGLYCERIN  (NITRODUR - DOSED IN MG/24 HR) 0.2 mg/hr patch Use 1/4 patch daily to the affected area. 30 patch 1   Current Facility-Administered Medications  Medication Dose Route Frequency Provider Last Rate Last Admin   triamcinolone  acetonide (KENALOG -40) injection 40 mg  40 mg Intramuscular Once Johnson, Sonovia L, MD        Medication Side Effects: none  Orders placed this visit:  No orders of the defined types were placed in this encounter.   Psychiatric Specialty Exam:  Review of Systems  Musculoskeletal:  Negative for gait problem.  Neurological:  Negative for tremors.  Psychiatric/Behavioral:         Please refer to HPI    There were no vitals taken for this visit.There is no height or weight on file to calculate BMI.  General Appearance: Casual and Neat   Eye Contact:  Good  Speech:  Clear and Coherent and Normal Rate  Volume:  Normal  Mood:  Anxious  Affect:  Appropriate and Congruent  Thought Process:  Coherent and Descriptions of Associations: Intact  Orientation:  Full (Time, Place, and Person)  Thought Content: Logical   Suicidal Thoughts:  No  Homicidal Thoughts:  No  Memory:  WNL  Judgement:  Good  Insight:  Good  Psychomotor Activity:  Normal  Concentration:  Concentration: Good and Attention Span: Good  Recall:  Good  Fund of Knowledge: Good  Language: Good  Assets:  Communication Skills Desire for Improvement Financial Resources/Insurance Housing Intimacy Leisure Time Physical Health Resilience Social Support Talents/Skills Transportation Vocational/Educational  ADL's:  Intact  Cognition: WNL  Prognosis:  Good   Screenings:  PHQ2-9    Flowsheet Row Office Visit from 08/23/2015 in  [facility] [place] Office Visit from 06/05/2015 in [facility] Care at [facility] Total Score 0 0   Flowsheet Row ED from 04/24/2023 in [facility] at [facility]  C-SSRS RISK CATEGORY No Risk    Receiving Psychotherapy: Yes   Treatment Plan/Recommendations:   Plan:  PDMP reviewed  Wellbutrin XL 150mg  - 3 every morning - reduced to 2 daily - will plan to reduce to one tablet Prozac 20mg  daily Valium 5mg  twice daily  RTC 4 weeks  115/69  60 minutes spent dedicated to the care of this patient on the date of this encounter to include pre-visit review of records, ordering of medication, post visit documentation, and face-to-face time with the patient discussing anxiety. Discussed continuing current medication regimen.  Discussed potential benefits, risk, and side effects of benzodiazepines to include potential risk of tolerance and dependence, as well as possible drowsiness.  Advised patient not to drive if experiencing drowsiness and to take  lowest possible effective dose to minimize risk of dependence and tolerance.   Patient advised to contact office with any questions, adverse effects, or acute worsening in signs and symptoms.    Jesaiah Fabiano N Giliana Vantil, NP

## 2023-10-21 ENCOUNTER — Ambulatory Visit: Admitting: Behavioral Health

## 2023-10-26 DIAGNOSIS — N1831 Chronic kidney disease, stage 3a: Secondary | ICD-10-CM | POA: Diagnosis not present

## 2023-10-26 DIAGNOSIS — F331 Major depressive disorder, recurrent, moderate: Secondary | ICD-10-CM | POA: Diagnosis not present

## 2023-10-28 DIAGNOSIS — N1831 Chronic kidney disease, stage 3a: Secondary | ICD-10-CM | POA: Diagnosis not present

## 2023-10-28 DIAGNOSIS — F411 Generalized anxiety disorder: Secondary | ICD-10-CM | POA: Diagnosis not present

## 2023-10-28 DIAGNOSIS — M47819 Spondylosis without myelopathy or radiculopathy, site unspecified: Secondary | ICD-10-CM | POA: Diagnosis not present

## 2023-10-28 DIAGNOSIS — E782 Mixed hyperlipidemia: Secondary | ICD-10-CM | POA: Diagnosis not present

## 2023-10-28 DIAGNOSIS — F321 Major depressive disorder, single episode, moderate: Secondary | ICD-10-CM | POA: Diagnosis not present

## 2023-11-04 ENCOUNTER — Telehealth: Admitting: Adult Health

## 2023-11-04 ENCOUNTER — Encounter: Payer: Self-pay | Admitting: Adult Health

## 2023-11-04 DIAGNOSIS — F411 Generalized anxiety disorder: Secondary | ICD-10-CM | POA: Diagnosis not present

## 2023-11-04 NOTE — Progress Notes (Signed)
 Alyssa Alvarez [id number] [birth year] 68 y.o.  Virtual Visit via Video Note  I connected with pt @ on 11/04/23 at 11:00 AM EDT by a video enabled telemedicine application and verified that I am speaking with the correct person using two identifiers.   I discussed the limitations of evaluation and management by telemedicine and the availability of in person appointments. The patient expressed understanding and agreed to proceed.  I discussed the assessment and treatment plan with the patient. The patient was provided an opportunity to ask questions and all were answered. The patient agreed with the plan and demonstrated an understanding of the instructions.   The patient was advised to call back or seek an in-person evaluation if the symptoms worsen or if the condition fails to improve as anticipated.  I provided 20 minutes of non-face-to-face time during this encounter.  The patient was located at home.  The provider was located at [facility].   [name], NP   Subjective:   Patient ID:  Alyssa Alvarez is a 69 y.o. (DOB 1956-02-02) female.  Chief Complaint: No chief complaint on file.   HPI Karita Dralle Woolworth presents for follow-up of GAD.  Referred by therapist [name].  Describes mood today as better. Pleasant. Denies tearfulness. Mood symptoms - denies depression. Reports stable interest and motivation. Reports decreased anxiety - 85 percent better. Denies recent panic attacks. Denies worry, rumination and over thinking. Denies obsessive thoughts and acts. Reports mood is stable. Feels like recent medication changes have been helpful. Stating I feel content. She has reduced Wellbutrin XL 450mg  daily to 150mg  daily and feels it has really helped with decreasing her anxiety. Willing to continue the Wellbutrin XL 150mg  daily for now. Will also continue Prozac and Valium as needed. Taking medications as prescribed.  Energy levels stable. Active, has a regular  exercise routine.  Enjoys some usual interests and activities. Divorced x 3. Lives alone with cat. Reports she has no family relationships. Has a daughter 67 and 4 grandchildren. Brother - deceased. Both parents local.  Has not spoken with parents in 54 or 12 years.  Appetite adequate. Weight stable - 104 to 108 pounds - 58 - scoliosis. Sleeps well most nights. Averages 15 to 16 hours a day. Focus and concentration stable. Completing tasks. Managing aspects of household. Works part time at [employer]. Denies SI or HI.  Denies AH or VH. Denies self harm. Denies substance use.  Reports a trauma history.  Working with [name] - therapist.  Review of Systems:  Review of Systems  Musculoskeletal:  Negative for gait problem.  Neurological:  Negative for tremors.  Psychiatric/Behavioral:         Please refer to HPI    Medications: I have reviewed the patient's current medications.  Current Outpatient Medications  Medication Sig Dispense Refill   buPROPion (WELLBUTRIN XL) 150 MG 24 hr tablet      buPROPion (WELLBUTRIN XL) 150 MG 24 hr tablet      clorazepate (TRANXENE) 3.75 MG tablet      clorazepate (TRANXENE) 7.5 MG tablet      Diclofenac Sodium (SOLARAZE) 3 % GEL      estradiol-norethindrone (ACTIVELLA) 1-0.5 MG per tablet      estradiol-norethindrone (ACTIVELLA) 1-0.5 MG tablet      glucosamine-chondroitin 500-400 MG tablet Take 1 tablet by mouth 3 (three) times daily. Reported on 06/05/2015     meloxicam  (MOBIC ) 7.5 MG tablet Take 1 tablet (7.5 mg total) by mouth  daily. 30 tablet 1   nitroGLYCERIN  (NITRODUR - DOSED IN MG/24 HR) 0.2 mg/hr patch Use 1/4 patch daily to the affected area. 30 patch 1   Current Facility-Administered Medications  Medication Dose Route Frequency Provider Last Rate Last Admin   triamcinolone  acetonide (KENALOG -40) injection 40 mg  40 mg Intramuscular Once Johnson, Sonovia L, MD        Medication Side Effects: None  Allergies: No Known  Allergies  Past Medical History:  Diagnosis Date   Anxiety     No family history on file.  Social History   Socioeconomic History   Marital status: Single    Spouse name: Not on file   Number of children: Not on file   Years of education: Not on file   Highest education level: Not on file  Occupational History   Not on file  Tobacco Use   Smoking status: Never   Smokeless tobacco: Never  Substance and Sexual Activity   Alcohol use: Never   Drug use: Never   Sexual activity: Not on file  Other Topics Concern   Not on file  Social History Narrative   Not on file   Social Drivers of Health   Financial Resource Strain: Not on file  Food Insecurity: Not on file  Transportation Needs: Not on file  Physical Activity: Not on file  Stress: Not on file  Social Connections: Not on file  Intimate Partner Violence: Not on file    Past Medical History, Surgical history, Social history, and Family history were reviewed and updated as appropriate.   Please see review of systems for further details on the patient's review from today.   Objective:   Physical Exam:  There were no vitals taken for this visit.  Physical Exam Constitutional:      General: She is not in acute distress. Musculoskeletal:        General: No deformity.  Neurological:     Mental Status: She is alert and oriented to person, place, and time.     Coordination: Coordination normal.  Psychiatric:        Attention and Perception: Attention and perception normal. She does not perceive auditory or visual hallucinations.        Mood and Affect: Mood normal. Mood is not anxious or depressed. Affect is not labile, blunt, angry or inappropriate.        Speech: Speech normal.        Behavior: Behavior normal.        Thought Content: Thought content normal. Thought content is not paranoid or delusional. Thought content does not include homicidal or suicidal ideation. Thought content does not include homicidal  or suicidal plan.        Cognition and Memory: Cognition and memory normal.        Judgment: Judgment normal.     Comments: Insight intact     Lab Review:  No results found for: NA, K, CL, CO2, GLUCOSE, BUN, CREATININE, CALCIUM, PROT, ALBUMIN, AST, ALT, ALKPHOS, BILITOT, GFRNONAA, GFRAA  No results found for: WBC, RBC, HGB, HCT, PLT, MCV, MCH, MCHC, RDW, LYMPHSABS, MONOABS, EOSABS, BASOSABS  No results found for: POCLITH, LITHIUM   No results found for: PHENYTOIN, PHENOBARB, VALPROATE, CBMZ   .res Assessment: Plan:    Treatment Plan/Recommendations:   Plan:  PDMP reviewed  Wellbutrin XL 150mg  one tablet daily - previous dose of 450mg  tapered down Prozac 20mg  daily Valium 5mg  daily - as needed  RTC 4 weeks  20 minutes  spent dedicated to the care of this patient on the date of this encounter to include pre-visit review of records, ordering of medication, post visit documentation, and face-to-face time with the patient discussing anxiety. Discussed continuing current medication regimen.  Discussed potential benefits, risk, and side effects of benzodiazepines to include potential risk of tolerance and dependence, as well as possible drowsiness.  Advised patient not to drive if experiencing drowsiness and to take lowest possible effective dose to minimize risk of dependence and tolerance.   Patient advised to contact office with any questions, adverse effects, or acute worsening in signs and symptoms.  Diagnoses and all orders for this visit:  Generalized anxiety disorder     Please see After Visit Summary for patient specific instructions.  No future appointments.   No orders of the defined types were placed in this encounter.     -------------------------------

## 2023-11-18 DIAGNOSIS — F331 Major depressive disorder, recurrent, moderate: Secondary | ICD-10-CM | POA: Diagnosis not present

## 2023-11-25 DIAGNOSIS — N1831 Chronic kidney disease, stage 3a: Secondary | ICD-10-CM | POA: Diagnosis not present

## 2023-11-28 DIAGNOSIS — N1831 Chronic kidney disease, stage 3a: Secondary | ICD-10-CM | POA: Diagnosis not present

## 2023-11-28 DIAGNOSIS — F411 Generalized anxiety disorder: Secondary | ICD-10-CM | POA: Diagnosis not present

## 2023-11-28 DIAGNOSIS — M47819 Spondylosis without myelopathy or radiculopathy, site unspecified: Secondary | ICD-10-CM | POA: Diagnosis not present

## 2023-11-28 DIAGNOSIS — E782 Mixed hyperlipidemia: Secondary | ICD-10-CM | POA: Diagnosis not present

## 2023-11-28 DIAGNOSIS — F321 Major depressive disorder, single episode, moderate: Secondary | ICD-10-CM | POA: Diagnosis not present

## 2023-12-01 DIAGNOSIS — R002 Palpitations: Secondary | ICD-10-CM | POA: Diagnosis not present

## 2023-12-01 DIAGNOSIS — T887XXA Unspecified adverse effect of drug or medicament, initial encounter: Secondary | ICD-10-CM | POA: Diagnosis not present

## 2023-12-01 DIAGNOSIS — R29818 Other symptoms and signs involving the nervous system: Secondary | ICD-10-CM | POA: Diagnosis not present

## 2023-12-02 ENCOUNTER — Encounter: Payer: Self-pay | Admitting: Adult Health

## 2023-12-02 ENCOUNTER — Telehealth (INDEPENDENT_AMBULATORY_CARE_PROVIDER_SITE_OTHER): Admitting: Adult Health

## 2023-12-02 DIAGNOSIS — F411 Generalized anxiety disorder: Secondary | ICD-10-CM

## 2023-12-02 MED ORDER — DIAZEPAM 5 MG PO TABS: 5.0000 mg | ORAL_TABLET | Freq: Every day | ORAL | 2 refills | Status: DC | PRN

## 2023-12-02 NOTE — Progress Notes (Signed)
 Alyssa Alvarez [id number] [birth year] 68 y.o.  Virtual Visit via Video Note  I connected with pt @ on 12/02/23 at  1:30 PM EDT by a video enabled telemedicine application and verified that I am speaking with the correct person using two identifiers.   I discussed the limitations of evaluation and management by telemedicine and the availability of in person appointments. The patient expressed understanding and agreed to proceed.  I discussed the assessment and treatment plan with the patient. The patient was provided an opportunity to ask questions and all were answered. The patient agreed with the plan and demonstrated an understanding of the instructions.   The patient was advised to call back or seek an in-person evaluation if the symptoms worsen or if the condition fails to improve as anticipated.  I provided 20 minutes of non-face-to-face time during this encounter.  The patient was located at home.  The provider was located at [facility].   Alyssa LOISE Sayers, NP   Subjective:   Patient ID:  Alyssa Alvarez is a 68 y.o. (DOB 05-Mar-1956) female.  Chief Complaint: No chief complaint on file.   HPI Alyssa Alvarez presents for follow-up of GAD.  Referred by therapist [name].  Describes mood today as ok. Pleasant. Denies tearfulness. Mood symptoms - denies depression. Reports stable interest and motivation. Denies anxiety. Denies recent panic attacks. Denies worry, rumination and over thinking. Denies obsessive thoughts and acts. Reports mood is stable. Stating I feel like I'm doing ok. Repots she decided to stop the Wellbutrin, Prozac and Valium . Reports she did not feel well with the changes and she restarted the Prozac 20mg  daily and Valium  5mg  daily as needed. She would like to consider further changes as tolerated. Taking medications as prescribed.  Energy levels stable. Active, has a regular exercise routine.  Enjoys some usual interests and activities.  Divorced x 3. Lives alone with cat. Reports she has no family relationships. Has a daughter 64 and 4 grandchildren. Brother - deceased. Both parents local.  Has not spoken with parents in 20 or 12 years.  Appetite adequate. Weight stable - 110 pounds - 58 - scoliosis. Sleep has been variable with recent medication/supplementation changes. Focus and concentration stable. Completing tasks. Managing aspects of household. Works part time at [employer]. Denies SI or HI.  Denies AH or VH. Denies self harm. Denies substance use.  Reports a trauma history.  Working with [name] - therapist.  Review of Systems:  Review of Systems  Musculoskeletal:  Negative for gait problem.  Neurological:  Negative for tremors.  Psychiatric/Behavioral:         Please refer to HPI    Medications: I have reviewed the patient's current medications.  Current Outpatient Medications  Medication Sig Dispense Refill   buPROPion (WELLBUTRIN XL) 150 MG 24 hr tablet      buPROPion (WELLBUTRIN XL) 150 MG 24 hr tablet      clorazepate (TRANXENE) 3.75 MG tablet      clorazepate (TRANXENE) 7.5 MG tablet      Diclofenac Sodium (SOLARAZE) 3 % GEL      estradiol-norethindrone (ACTIVELLA) 1-0.5 MG per tablet      estradiol-norethindrone (ACTIVELLA) 1-0.5 MG tablet      glucosamine-chondroitin 500-400 MG tablet Take 1 tablet by mouth 3 (three) times daily. Reported on 06/05/2015     meloxicam  (MOBIC ) 7.5 MG tablet Take 1 tablet (7.5 mg total) by mouth daily. 30 tablet 1   nitroGLYCERIN  (NITRODUR - DOSED IN  MG/24 HR) 0.2 mg/hr patch Use 1/4 patch daily to the affected area. 30 patch 1   Current Facility-Administered Medications  Medication Dose Route Frequency Provider Last Rate Last Admin   triamcinolone  acetonide (KENALOG -40) injection 40 mg  40 mg Intramuscular Once Johnson, Sonovia L, MD        Medication Side Effects: None  Allergies: No Known Allergies  Past Medical History:  Diagnosis Date   Anxiety      No family history on file.  Social History   Socioeconomic History   Marital status: Single    Spouse name: Not on file   Number of children: Not on file   Years of education: Not on file   Highest education level: Not on file  Occupational History   Not on file  Tobacco Use   Smoking status: Never   Smokeless tobacco: Never  Substance and Sexual Activity   Alcohol use: Never   Drug use: Never   Sexual activity: Not on file  Other Topics Concern   Not on file  Social History Narrative   Not on file   Social Drivers of Health   Financial Resource Strain: Not on file  Food Insecurity: Not on file  Transportation Needs: Not on file  Physical Activity: Not on file  Stress: Not on file  Social Connections: Not on file  Intimate Partner Violence: Not on file    Past Medical History, Surgical history, Social history, and Family history were reviewed and updated as appropriate.   Please see review of systems for further details on the patient's review from today.   Objective:   Physical Exam:  There were no vitals taken for this visit.  Physical Exam Constitutional:      General: She is not in acute distress. Musculoskeletal:        General: No deformity.  Neurological:     Mental Status: She is alert and oriented to person, place, and time.     Coordination: Coordination normal.  Psychiatric:        Attention and Perception: Attention and perception normal. She does not perceive auditory or visual hallucinations.        Mood and Affect: Mood normal. Mood is not anxious or depressed. Affect is not labile, blunt, angry or inappropriate.        Speech: Speech normal.        Behavior: Behavior normal.        Thought Content: Thought content normal. Thought content is not paranoid or delusional. Thought content does not include homicidal or suicidal ideation. Thought content does not include homicidal or suicidal plan.        Cognition and Memory: Cognition and  memory normal.        Judgment: Judgment normal.     Comments: Insight intact     Lab Review:  No results found for: NA, K, CL, CO2, GLUCOSE, BUN, CREATININE, CALCIUM, PROT, ALBUMIN, AST, ALT, ALKPHOS, BILITOT, GFRNONAA, GFRAA  No results found for: WBC, RBC, HGB, HCT, PLT, MCV, MCH, MCHC, RDW, LYMPHSABS, MONOABS, EOSABS, BASOSABS  No results found for: POCLITH, LITHIUM   No results found for: PHENYTOIN, PHENOBARB, VALPROATE, CBMZ   .res Assessment: Plan:    Treatment Plan/Recommendations:   Plan:  PDMP reviewed  D/C Wellbutrin XL 150mg  one tablet daily   Prozac 20mg  daily Valium  5mg  daily - as needed  RTC 4 weeks  20 minutes spent dedicated to the care of this patient on the date of this encounter to  include pre-visit review of records, ordering of medication, post visit documentation, and face-to-face time with the patient discussing anxiety. Discussed continuing current medication regimen.  Discussed potential benefits, risk, and side effects of benzodiazepines to include potential risk of tolerance and dependence, as well as possible drowsiness. Advised patient not to drive if experiencing drowsiness and to take lowest possible effective dose to minimize risk of dependence and tolerance.   Patient advised to contact office with any questions, adverse effects, or acute worsening in signs and symptoms.  There are no diagnoses linked to this encounter.   Please see After Visit Summary for patient specific instructions.  Future Appointments  Date Time Provider Department Center  12/02/2023  1:30 PM Keiran Sias Nattalie, NP [facility] None    No orders of the defined types were placed in this encounter.     -------------------------------

## 2023-12-20 DIAGNOSIS — F331 Major depressive disorder, recurrent, moderate: Secondary | ICD-10-CM | POA: Diagnosis not present

## 2023-12-25 DIAGNOSIS — N1831 Chronic kidney disease, stage 3a: Secondary | ICD-10-CM | POA: Diagnosis not present

## 2023-12-28 DIAGNOSIS — M47819 Spondylosis without myelopathy or radiculopathy, site unspecified: Secondary | ICD-10-CM | POA: Diagnosis not present

## 2023-12-28 DIAGNOSIS — F321 Major depressive disorder, single episode, moderate: Secondary | ICD-10-CM | POA: Diagnosis not present

## 2023-12-28 DIAGNOSIS — E782 Mixed hyperlipidemia: Secondary | ICD-10-CM | POA: Diagnosis not present

## 2023-12-28 DIAGNOSIS — N1831 Chronic kidney disease, stage 3a: Secondary | ICD-10-CM | POA: Diagnosis not present

## 2023-12-28 DIAGNOSIS — F411 Generalized anxiety disorder: Secondary | ICD-10-CM | POA: Diagnosis not present

## 2023-12-30 ENCOUNTER — Encounter: Payer: Self-pay | Admitting: Adult Health

## 2023-12-30 ENCOUNTER — Telehealth (INDEPENDENT_AMBULATORY_CARE_PROVIDER_SITE_OTHER): Admitting: Adult Health

## 2023-12-30 DIAGNOSIS — F411 Generalized anxiety disorder: Secondary | ICD-10-CM | POA: Diagnosis not present

## 2023-12-30 MED ORDER — FLUOXETINE HCL 20 MG PO CAPS: 20.0000 mg | ORAL_CAPSULE | Freq: Every day | ORAL | 2 refills | Status: DC

## 2023-12-30 NOTE — Progress Notes (Signed)
 Alyssa Alvarez [id number] [birth year] 68 y.o.  Virtual Visit via Video Note  I connected with pt @ on 12/30/23 at 12:00 PM EDT by a video enabled telemedicine application and verified that I am speaking with the correct person using two identifiers.   I discussed the limitations of evaluation and management by telemedicine and the availability of in person appointments. The patient expressed understanding and agreed to proceed.  I discussed the assessment and treatment plan with the patient. The patient was provided an opportunity to ask questions and all were answered. The patient agreed with the plan and demonstrated an understanding of the instructions.   The patient was advised to call back or seek an in-person evaluation if the symptoms worsen or if the condition fails to improve as anticipated.  I provided 10 minutes of non-face-to-face time during this encounter.  The patient was located at home.  The provider was located at [facility].   [name], NP   Subjective:   Patient ID:  Alyssa Alvarez is a 68 y.o. (DOB 1955/12/18) female.  Chief Complaint: No chief complaint on file.   HPI Mickelle Goupil Viana presents for follow-up of GAD.  Referred by therapist [name].  Describes mood today as ok. Pleasant. Denies tearfulness. Mood symptoms - reports a little depression. Reports stable interest and motivation. Reports anxiety. Reports a recent panic attack. Reports worry, rumination and over thinking. Denies obsessive thoughts and acts. Reports mood is stable. Stating I feel like I'm doing ok - content. Reports taking Prozac 20mg  daily and Valium  5mg  daily as needed. Taking medications as prescribed.  Energy levels stable. Active, has a regular exercise routine.  Enjoys some usual interests and activities. Divorced x 3. Lives alone with cat. Reports she has no family relationships. Has a daughter 37 and 4 grandchildren. Brother - deceased. Both parents  local.  Has not spoken with parents in 75 or 12 years.  Appetite adequate. Weight stable - 110 pounds - 59 - scoliosis. Sleep has been variable with recent medication/supplementation changes. Focus and concentration stable. Completing tasks. Managing aspects of household. Works part time at [employer]. Denies SI or HI.  Denies AH or VH. Denies self harm. Denies substance use.  Reports a trauma history.  Working with [name] - therapist.  Review of Systems:  Review of Systems  Musculoskeletal:  Negative for gait problem.  Neurological:  Negative for tremors.  Psychiatric/Behavioral:         Please refer to HPI    Medications: I have reviewed the patient's current medications.  Current Outpatient Medications  Medication Sig Dispense Refill   diazepam  (VALIUM ) 5 MG tablet Take 1 tablet (5 mg total) by mouth daily as needed for anxiety. 30 tablet 2   Diclofenac Sodium (SOLARAZE) 3 % GEL      estradiol-norethindrone (ACTIVELLA) 1-0.5 MG per tablet      estradiol-norethindrone (ACTIVELLA) 1-0.5 MG tablet      glucosamine-chondroitin 500-400 MG tablet Take 1 tablet by mouth 3 (three) times daily. Reported on 06/05/2015     meloxicam  (MOBIC ) 7.5 MG tablet Take 1 tablet (7.5 mg total) by mouth daily. 30 tablet 1   nitroGLYCERIN  (NITRODUR - DOSED IN MG/24 HR) 0.2 mg/hr patch Use 1/4 patch daily to the affected area. 30 patch 1   Current Facility-Administered Medications  Medication Dose Route Frequency Provider Last Rate Last Admin   triamcinolone  acetonide (KENALOG -40) injection 40 mg  40 mg Intramuscular Once Johnson, Sonovia L, MD  Medication Side Effects: None  Allergies: No Known Allergies  Past Medical History:  Diagnosis Date   Anxiety     No family history on file.  Social History   Socioeconomic History   Marital status: Single    Spouse name: Not on file   Number of children: Not on file   Years of education: Not on file   Highest education level:  Not on file  Occupational History   Not on file  Tobacco Use   Smoking status: Never   Smokeless tobacco: Never  Substance and Sexual Activity   Alcohol use: Never   Drug use: Never   Sexual activity: Not on file  Other Topics Concern   Not on file  Social History Narrative   Not on file   Social Drivers of Health   Financial Resource Strain: Not on file  Food Insecurity: Not on file  Transportation Needs: Not on file  Physical Activity: Not on file  Stress: Not on file  Social Connections: Not on file  Intimate Partner Violence: Not on file    Past Medical History, Surgical history, Social history, and Family history were reviewed and updated as appropriate.   Please see review of systems for further details on the patient's review from today.   Objective:   Physical Exam:  There were no vitals taken for this visit.  Physical Exam Constitutional:      General: She is not in acute distress. Musculoskeletal:        General: No deformity.  Neurological:     Mental Status: She is alert and oriented to person, place, and time.     Coordination: Coordination normal.  Psychiatric:        Attention and Perception: Attention and perception normal. She does not perceive auditory or visual hallucinations.        Mood and Affect: Mood normal. Mood is not anxious or depressed. Affect is not labile, blunt, angry or inappropriate.        Speech: Speech normal.        Behavior: Behavior normal.        Thought Content: Thought content normal. Thought content is not paranoid or delusional. Thought content does not include homicidal or suicidal ideation. Thought content does not include homicidal or suicidal plan.        Cognition and Memory: Cognition and memory normal.        Judgment: Judgment normal.     Comments: Insight intact     Lab Review:  No results found for: NA, K, CL, CO2, GLUCOSE, BUN, CREATININE, CALCIUM, PROT, ALBUMIN, AST, ALT,  ALKPHOS, BILITOT, GFRNONAA, GFRAA  No results found for: WBC, RBC, HGB, HCT, PLT, MCV, MCH, MCHC, RDW, LYMPHSABS, MONOABS, EOSABS, BASOSABS  No results found for: POCLITH, LITHIUM   No results found for: PHENYTOIN, PHENOBARB, VALPROATE, CBMZ   .res Assessment: Plan:    Treatment Plan/Recommendations:   Plan:  PDMP reviewed  Prozac 20mg  daily Valium  5mg  daily - as needed  RTC 4 weeks  10 minutes spent dedicated to the care of this patient on the date of this encounter to include pre-visit review of records, ordering of medication, post visit documentation, and face-to-face time with the patient discussing anxiety. Discussed continuing current medication regimen.  Discussed potential benefits, risk, and side effects of benzodiazepines to include potential risk of tolerance and dependence, as well as possible drowsiness. Advised patient not to drive if experiencing drowsiness and to take lowest possible effective dose to minimize  risk of dependence and tolerance.   Patient advised to contact office with any questions, adverse effects, or acute worsening in signs and symptoms.  There are no diagnoses linked to this encounter.   Please see After Visit Summary for patient specific instructions.  Future Appointments  Date Time Provider Department Center  12/30/2023 12:00 PM Taisia Fantini Nattalie, NP [facility] None    No orders of the defined types were placed in this encounter.     -------------------------------

## 2024-01-04 DIAGNOSIS — L814 Other melanin hyperpigmentation: Secondary | ICD-10-CM | POA: Diagnosis not present

## 2024-01-04 DIAGNOSIS — L570 Actinic keratosis: Secondary | ICD-10-CM | POA: Diagnosis not present

## 2024-01-04 DIAGNOSIS — D225 Melanocytic nevi of trunk: Secondary | ICD-10-CM | POA: Diagnosis not present

## 2024-01-04 DIAGNOSIS — L578 Other skin changes due to chronic exposure to nonionizing radiation: Secondary | ICD-10-CM | POA: Diagnosis not present

## 2024-01-04 DIAGNOSIS — L821 Other seborrheic keratosis: Secondary | ICD-10-CM | POA: Diagnosis not present

## 2024-01-04 DIAGNOSIS — Z85828 Personal history of other malignant neoplasm of skin: Secondary | ICD-10-CM | POA: Diagnosis not present

## 2024-01-19 DIAGNOSIS — F331 Major depressive disorder, recurrent, moderate: Secondary | ICD-10-CM | POA: Diagnosis not present

## 2024-01-27 DIAGNOSIS — M4126 Other idiopathic scoliosis, lumbar region: Secondary | ICD-10-CM | POA: Diagnosis not present

## 2024-01-27 DIAGNOSIS — Z01419 Encounter for gynecological examination (general) (routine) without abnormal findings: Secondary | ICD-10-CM | POA: Diagnosis not present

## 2024-01-27 DIAGNOSIS — N958 Other specified menopausal and perimenopausal disorders: Secondary | ICD-10-CM | POA: Diagnosis not present

## 2024-01-27 DIAGNOSIS — R32 Unspecified urinary incontinence: Secondary | ICD-10-CM | POA: Diagnosis not present

## 2024-01-28 DIAGNOSIS — F411 Generalized anxiety disorder: Secondary | ICD-10-CM | POA: Diagnosis not present

## 2024-01-28 DIAGNOSIS — F321 Major depressive disorder, single episode, moderate: Secondary | ICD-10-CM | POA: Diagnosis not present

## 2024-01-28 DIAGNOSIS — N1831 Chronic kidney disease, stage 3a: Secondary | ICD-10-CM | POA: Diagnosis not present

## 2024-01-28 DIAGNOSIS — M47819 Spondylosis without myelopathy or radiculopathy, site unspecified: Secondary | ICD-10-CM | POA: Diagnosis not present

## 2024-01-28 DIAGNOSIS — E782 Mixed hyperlipidemia: Secondary | ICD-10-CM | POA: Diagnosis not present

## 2024-02-03 ENCOUNTER — Encounter: Payer: Self-pay | Admitting: Adult Health

## 2024-02-03 ENCOUNTER — Telehealth (INDEPENDENT_AMBULATORY_CARE_PROVIDER_SITE_OTHER): Admitting: Adult Health

## 2024-02-03 DIAGNOSIS — F411 Generalized anxiety disorder: Secondary | ICD-10-CM | POA: Diagnosis not present

## 2024-02-03 MED ORDER — DIAZEPAM 5 MG PO TABS: 5.0000 mg | ORAL_TABLET | Freq: Every day | ORAL | 2 refills | Status: AC | PRN

## 2024-02-03 MED ORDER — FLUOXETINE HCL 20 MG PO CAPS: 20.0000 mg | ORAL_CAPSULE | Freq: Every day | ORAL | 2 refills | Status: AC

## 2024-02-03 NOTE — Progress Notes (Signed)
 Alyssa Alvarez [id number] 18-Mar-1956 68 y.o.  Virtual Visit via Video Note  I connected with pt @ on 02/03/24 at 12:00 PM EST by a video enabled telemedicine application and verified that I am speaking with the correct person using two identifiers.   I discussed the limitations of evaluation and management by telemedicine and the availability of in person appointments. The patient expressed understanding and agreed to proceed.  I discussed the assessment and treatment plan with the patient. The patient was provided an opportunity to ask questions and all were answered. The patient agreed with the plan and demonstrated an understanding of the instructions.   The patient was advised to call back or seek an in-person evaluation if the symptoms worsen or if the condition fails to improve as anticipated.  I provided 20 minutes of non-face-to-face time during this encounter.  The patient was located at home.  The provider was located at [facility].   [name], NP   Subjective:   Patient ID:  Alyssa Alvarez is a 68 y.o. (DOB [birth year]) female.  Chief Complaint: No chief complaint on file.   HPI Alyssa Alvarez presents for follow-up of GAD.  Referred by therapist [name].  Describes mood today as ok. Pleasant. Denies tearfulness. Mood symptoms - reports some situational anxiety, depression and irritability. Reports stable interest and motivation. Denies recent panic attacks. Denies worry, rumination and over thinking. Denies obsessive thoughts and acts. Reports mood is stable. Stating I feel like I'm doing alright. Feels like medications are helpful. Reports taking Prozac 20mg  daily and Valium  5mg  daily as needed.  Energy levels stable. Active, has a regular exercise routine.  Enjoys some usual interests and activities. Divorced x 3. Lives alone with cat. Has a daughter 13 and 4 grandchildren. Brother - deceased. Both parents local.  Has not spoken with  parents in 89 or 12 years.  Appetite adequate. Weight stable - 110 pounds - 59 - scoliosis. Sleeps well most nights. Averages 7 to 10 hours. Focus and concentration stable. Completing tasks. Managing aspects of household. Works part time at [employer]. Denies SI or HI.  Denies AH or VH. Denies self harm. Denies substance use.  Reports a trauma history.  Working with [name] - therapist.  Review of Systems:  Review of Systems  Musculoskeletal:  Negative for gait problem.  Neurological:  Negative for tremors.  Psychiatric/Behavioral:         Please refer to HPI    Medications: I have reviewed the patient's current medications.  Current Outpatient Medications  Medication Sig Dispense Refill   diazepam  (VALIUM ) 5 MG tablet Take 1 tablet (5 mg total) by mouth daily as needed for anxiety. 30 tablet 2   Diclofenac Sodium (SOLARAZE) 3 % GEL      estradiol-norethindrone (ACTIVELLA) 1-0.5 MG per tablet      estradiol-norethindrone (ACTIVELLA) 1-0.5 MG tablet      FLUoxetine (PROZAC) 20 MG capsule Take 1 capsule (20 mg total) by mouth daily. 30 capsule 2   glucosamine-chondroitin 500-400 MG tablet Take 1 tablet by mouth 3 (three) times daily. Reported on 06/05/2015     meloxicam  (MOBIC ) 7.5 MG tablet Take 1 tablet (7.5 mg total) by mouth daily. 30 tablet 1   nitroGLYCERIN  (NITRODUR - DOSED IN MG/24 HR) 0.2 mg/hr patch Use 1/4 patch daily to the affected area. 30 patch 1   Current Facility-Administered Medications  Medication Dose Route Frequency Provider Last Rate Last Admin   triamcinolone  acetonide (KENALOG -40)  injection 40 mg  40 mg Intramuscular Once Johnson, Sonovia L, MD        Medication Side Effects: None  Allergies: No Known Allergies  Past Medical History:  Diagnosis Date   Anxiety     No family history on file.  Social History   Socioeconomic History   Marital status: Single    Spouse name: Not on file   Number of children: Not on file   Years of  education: Not on file   Highest education level: Not on file  Occupational History   Not on file  Tobacco Use   Smoking status: Never   Smokeless tobacco: Never  Substance and Sexual Activity   Alcohol use: Never   Drug use: Never   Sexual activity: Not on file  Other Topics Concern   Not on file  Social History Narrative   Not on file   Social Drivers of Health   Financial Resource Strain: Not on file  Food Insecurity: Not on file  Transportation Needs: Not on file  Physical Activity: Not on file  Stress: Not on file  Social Connections: Not on file  Intimate Partner Violence: Not on file    Past Medical History, Surgical history, Social history, and Family history were reviewed and updated as appropriate.   Please see review of systems for further details on the patient's review from today.   Objective:   Physical Exam:  There were no vitals taken for this visit.  Physical Exam Constitutional:      General: She is not in acute distress. Musculoskeletal:        General: No deformity.  Neurological:     Mental Status: She is alert and oriented to person, place, and time.     Coordination: Coordination normal.  Psychiatric:        Attention and Perception: Attention and perception normal. She does not perceive auditory or visual hallucinations.        Mood and Affect: Mood normal. Mood is not anxious or depressed. Affect is not labile, blunt, angry or inappropriate.        Speech: Speech normal.        Behavior: Behavior normal.        Thought Content: Thought content normal. Thought content is not paranoid or delusional. Thought content does not include homicidal or suicidal ideation. Thought content does not include homicidal or suicidal plan.        Cognition and Memory: Cognition and memory normal.        Judgment: Judgment normal.     Comments: Insight intact     Lab Review:  No results found for: NA, K, CL, CO2, GLUCOSE, BUN, CREATININE,  CALCIUM, PROT, ALBUMIN, AST, ALT, ALKPHOS, BILITOT, GFRNONAA, GFRAA  No results found for: WBC, RBC, HGB, HCT, PLT, MCV, MCH, MCHC, RDW, LYMPHSABS, MONOABS, EOSABS, BASOSABS  No results found for: POCLITH, LITHIUM   No results found for: PHENYTOIN, PHENOBARB, VALPROATE, CBMZ   .res Assessment: Plan:    Treatment Plan/Recommendations:   Plan:  PDMP reviewed  Prozac 20mg  daily Valium  5mg  daily - as needed  RTC 4 weeks  20 minutes spent dedicated to the care of this patient on the date of this encounter to include pre-visit review of records, ordering of medication, post visit documentation, and face-to-face time with the patient discussing anxiety. Discussed continuing current medication regimen.  Discussed potential benefits, risk, and side effects of benzodiazepines to include potential risk of tolerance and dependence, as well as  possible drowsiness. Advised patient not to drive if experiencing drowsiness and to take lowest possible effective dose to minimize risk of dependence and tolerance.   Patient advised to contact office with any questions, adverse effects, or acute worsening in signs and symptoms. There are no diagnoses linked to this encounter.   Please see After Visit Summary for patient specific instructions.  No future appointments.  No orders of the defined types were placed in this encounter.     -------------------------------

## 2024-02-08 DIAGNOSIS — F331 Major depressive disorder, recurrent, moderate: Secondary | ICD-10-CM | POA: Diagnosis not present

## 2024-02-27 DIAGNOSIS — F411 Generalized anxiety disorder: Secondary | ICD-10-CM | POA: Diagnosis not present

## 2024-02-27 DIAGNOSIS — M47819 Spondylosis without myelopathy or radiculopathy, site unspecified: Secondary | ICD-10-CM | POA: Diagnosis not present

## 2024-02-27 DIAGNOSIS — N1831 Chronic kidney disease, stage 3a: Secondary | ICD-10-CM | POA: Diagnosis not present

## 2024-02-27 DIAGNOSIS — F321 Major depressive disorder, single episode, moderate: Secondary | ICD-10-CM | POA: Diagnosis not present

## 2024-02-27 DIAGNOSIS — E782 Mixed hyperlipidemia: Secondary | ICD-10-CM | POA: Diagnosis not present

## 2024-03-01 DIAGNOSIS — F331 Major depressive disorder, recurrent, moderate: Secondary | ICD-10-CM | POA: Diagnosis not present

## 2024-03-02 ENCOUNTER — Telehealth: Admitting: Adult Health

## 2024-03-02 ENCOUNTER — Encounter: Payer: Self-pay | Admitting: Adult Health

## 2024-03-02 DIAGNOSIS — F411 Generalized anxiety disorder: Secondary | ICD-10-CM

## 2024-03-02 NOTE — Progress Notes (Signed)
 Alyssa Alvarez [id number] 1955/08/06 68 y.o.  Virtual Visit via Video Note  I connected with pt @ on 03/02/24 at 12:00 PM EST by a video enabled telemedicine application and verified that I am speaking with the correct person using two identifiers.   I discussed the limitations of evaluation and management by telemedicine and the availability of in person appointments. The patient expressed understanding and agreed to proceed.  I discussed the assessment and treatment plan with the patient. The patient was provided an opportunity to ask questions and all were answered. The patient agreed with the plan and demonstrated an understanding of the instructions.   The patient was advised to call back or seek an in-person evaluation if the symptoms worsen or if the condition fails to improve as anticipated.  I provided 15 minutes of non-face-to-face time during this encounter.  The patient was located at home.  The provider was located at [facility].   [name], NP   Subjective:   Patient ID:  Alyssa Alvarez is a 68 y.o. (DOB 09/17/1955) female.  Chief Complaint: No chief complaint on file.   HPI [name] presents for follow-up of GAD.  Describes mood today as ok. Pleasant. Denies tearfulness. Mood symptoms - reports some anxiety, depression and irritability. Reports stable interest and motivation. Denies recent panic attacks. Reports some worry, rumination and over thinking. Denies obsessive thoughts and acts. Reports mood is stable. Stating I feel like I'm doing much better. Feels like medications are helpful. Reports taking Prozac  20mg  daily and Valium  5mg  daily as needed.  Energy levels stable. Active, has a regular exercise routine.  Enjoys some usual interests and activities. Divorced x 3. Lives alone with cat. Has a daughter 33 and 4 grandchildren.  Appetite adequate. Weight gain - 113 pounds - 59 - scoliosis. Sleeps well most nights. Averages 7 to 10  hours. Focus and concentration stable. Completing tasks. Managing aspects of household. Works part time at [employer]. Denies SI or HI.  Denies AH or VH. Denies self harm. Denies substance use.  Reports a trauma history.  Working with [name] - therapist.  Review of Systems:  Review of Systems  Musculoskeletal:  Negative for gait problem.  Neurological:  Negative for tremors.  Psychiatric/Behavioral:         Please refer to HPI    Medications: I have reviewed the patient's current medications.  Current Outpatient Medications  Medication Sig Dispense Refill   diazepam  (VALIUM ) 5 MG tablet Take 1 tablet (5 mg total) by mouth daily as needed for anxiety. 30 tablet 2   Diclofenac Sodium (SOLARAZE) 3 % GEL      estradiol-norethindrone (ACTIVELLA) 1-0.5 MG per tablet      estradiol-norethindrone (ACTIVELLA) 1-0.5 MG tablet      FLUoxetine  (PROZAC ) 20 MG capsule Take 1 capsule (20 mg total) by mouth daily. 30 capsule 2   glucosamine-chondroitin 500-400 MG tablet Take 1 tablet by mouth 3 (three) times daily. Reported on 06/05/2015     meloxicam  (MOBIC ) 7.5 MG tablet Take 1 tablet (7.5 mg total) by mouth daily. 30 tablet 1   nitroGLYCERIN  (NITRODUR - DOSED IN MG/24 HR) 0.2 mg/hr patch Use 1/4 patch daily to the affected area. 30 patch 1   Current Facility-Administered Medications  Medication Dose Route Frequency Provider Last Rate Last Admin   triamcinolone  acetonide (KENALOG -40) injection 40 mg  40 mg Intramuscular Once Johnson, Sonovia L, MD        Medication Side Effects:  None  Allergies: No Known Allergies  Past Medical History:  Diagnosis Date   Anxiety     No family history on file.  Social History   Socioeconomic History   Marital status: Single    Spouse name: Not on file   Number of children: Not on file   Years of education: Not on file   Highest education level: Not on file  Occupational History   Not on file  Tobacco Use   Smoking status: Never    Smokeless tobacco: Never  Substance and Sexual Activity   Alcohol use: Never   Drug use: Never   Sexual activity: Not on file  Other Topics Concern   Not on file  Social History Narrative   Not on file   Social Drivers of Health   Financial Resource Strain: Not on file  Food Insecurity: Not on file  Transportation Needs: Not on file  Physical Activity: Not on file  Stress: Not on file  Social Connections: Not on file  Intimate Partner Violence: Not on file    Past Medical History, Surgical history, Social history, and Family history were reviewed and updated as appropriate.   Please see review of systems for further details on the patient's review from today.   Objective:   Physical Exam:  There were no vitals taken for this visit.  Physical Exam Constitutional:      General: She is not in acute distress. Musculoskeletal:        General: No deformity.  Neurological:     Mental Status: She is alert and oriented to person, place, and time.     Coordination: Coordination normal.  Psychiatric:        Attention and Perception: Attention and perception normal. She does not perceive auditory or visual hallucinations.        Mood and Affect: Mood normal. Mood is not anxious or depressed. Affect is not labile, blunt, angry or inappropriate.        Speech: Speech normal.        Behavior: Behavior normal.        Thought Content: Thought content normal. Thought content is not paranoid or delusional. Thought content does not include homicidal or suicidal ideation. Thought content does not include homicidal or suicidal plan.        Cognition and Memory: Cognition and memory normal.        Judgment: Judgment normal.     Comments: Insight intact     Lab Review:  No results found for: NA, K, CL, CO2, GLUCOSE, BUN, CREATININE, CALCIUM, PROT, ALBUMIN, AST, ALT, ALKPHOS, BILITOT, GFRNONAA, GFRAA  No results found for: WBC, RBC, HGB, HCT, PLT,  MCV, MCH, MCHC, RDW, LYMPHSABS, MONOABS, EOSABS, BASOSABS  No results found for: POCLITH, LITHIUM   No results found for: PHENYTOIN, PHENOBARB, VALPROATE, CBMZ   .res Assessment: Plan:    Treatment Plan/Recommendations:   Plan:  PDMP reviewed  Prozac  20mg  daily Valium  5mg  daily - as needed  RTC 8 weeks  15 minutes spent dedicated to the care of this patient on the date of this encounter to include pre-visit review of records, ordering of medication, post visit documentation, and face-to-face time with the patient discussing anxiety. Discussed continuing current medication regimen.  Discussed potential benefits, risk, and side effects of benzodiazepines to include potential risk of tolerance and dependence, as well as possible drowsiness. Advised patient not to drive if experiencing drowsiness and to take lowest possible effective dose to minimize risk of dependence  and tolerance.   Patient advised to contact office with any questions, adverse effects, or acute worsening in signs and symptoms.  There are no diagnoses linked to this encounter.   Please see After Visit Summary for patient specific instructions.  Future Appointments  Date Time Provider Department Center  03/02/2024 12:00 PM Lyrah Bradt Nattalie, NP [facility] None    No orders of the defined types were placed in this encounter.     -------------------------------
